# Patient Record
Sex: Male | Born: 1955 | Race: White | Hispanic: No | Smoking: Never smoker
Health system: Southern US, Community
[De-identification: ages and names within clinical notes are randomized; demographics above are authoritative.]

## PROBLEM LIST (undated history)

## (undated) DIAGNOSIS — I499 Cardiac arrhythmia, unspecified: Secondary | ICD-10-CM

## (undated) DIAGNOSIS — E785 Hyperlipidemia, unspecified: Secondary | ICD-10-CM

## (undated) DIAGNOSIS — R7303 Prediabetes: Secondary | ICD-10-CM

## (undated) DIAGNOSIS — E119 Type 2 diabetes mellitus without complications: Secondary | ICD-10-CM

## (undated) DIAGNOSIS — E079 Disorder of thyroid, unspecified: Secondary | ICD-10-CM

## (undated) DIAGNOSIS — I10 Essential (primary) hypertension: Secondary | ICD-10-CM

## (undated) DIAGNOSIS — E039 Hypothyroidism, unspecified: Secondary | ICD-10-CM

## (undated) DIAGNOSIS — Z87442 Personal history of urinary calculi: Secondary | ICD-10-CM

## (undated) DIAGNOSIS — G4730 Sleep apnea, unspecified: Secondary | ICD-10-CM

## (undated) DIAGNOSIS — E66811 Obesity, class 1: Secondary | ICD-10-CM

## (undated) DIAGNOSIS — C439 Malignant melanoma of skin, unspecified: Secondary | ICD-10-CM

## (undated) DIAGNOSIS — C801 Malignant (primary) neoplasm, unspecified: Secondary | ICD-10-CM

## (undated) HISTORY — DX: Sleep apnea, unspecified: G47.30

## (undated) HISTORY — PX: FOOT SURGERY: SHX648

---

## 1998-12-08 ENCOUNTER — Inpatient Hospital Stay (HOSPITAL_COMMUNITY): Admission: RE | Admit: 1998-12-08 | Discharge: 1998-12-09 | Payer: Self-pay | Admitting: Neurosurgery

## 1998-12-08 ENCOUNTER — Encounter: Payer: Self-pay | Admitting: Neurosurgery

## 2004-01-10 HISTORY — PX: BACK SURGERY: SHX140

## 2006-05-02 ENCOUNTER — Ambulatory Visit: Payer: Self-pay | Admitting: Podiatry

## 2007-09-12 ENCOUNTER — Emergency Department: Payer: Self-pay | Admitting: Emergency Medicine

## 2007-09-30 ENCOUNTER — Ambulatory Visit: Payer: Self-pay | Admitting: Specialist

## 2007-10-02 ENCOUNTER — Ambulatory Visit: Payer: Self-pay | Admitting: Specialist

## 2007-10-10 ENCOUNTER — Ambulatory Visit: Payer: Self-pay | Admitting: Specialist

## 2009-05-29 ENCOUNTER — Emergency Department: Payer: Self-pay | Admitting: Unknown Physician Specialty

## 2009-07-24 IMAGING — CT CT ABD-PELV W/O CM
1 of 2 series · 15 of 32 positions shown, 19 images · non-contrast
Comparison: none

REASON FOR EXAM: (1) left flank pain; (2) left flank pain
COMMENTS:

[Series 2: stone · axial · 0.77mm/px · z∈[-904,-493]mm · 15 of 154 slices shown, 19 images]
[im 11/154  soft-tissue]
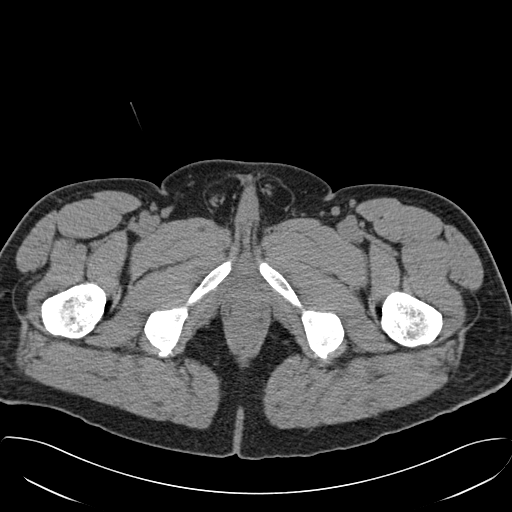
[im 11/154  bone]
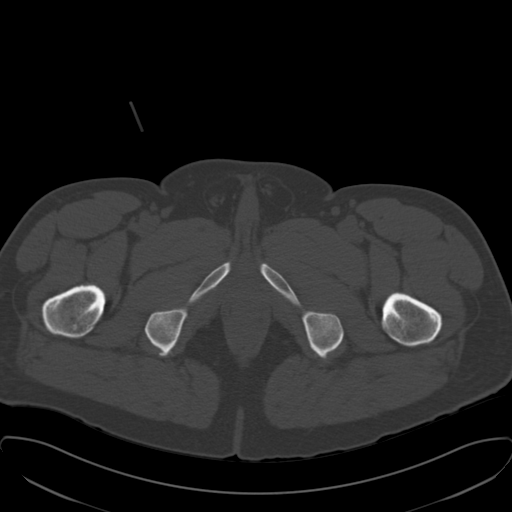
[im 22/154  soft-tissue]
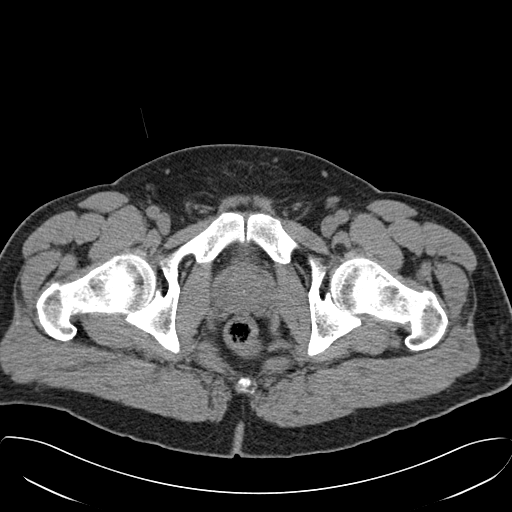
[im 33/154  soft-tissue]
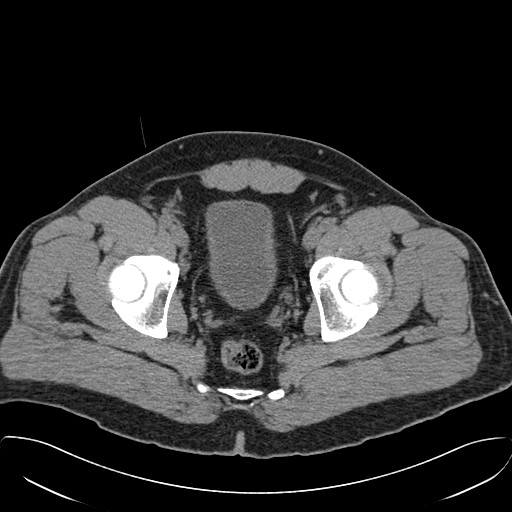
[im 44/154  soft-tissue]
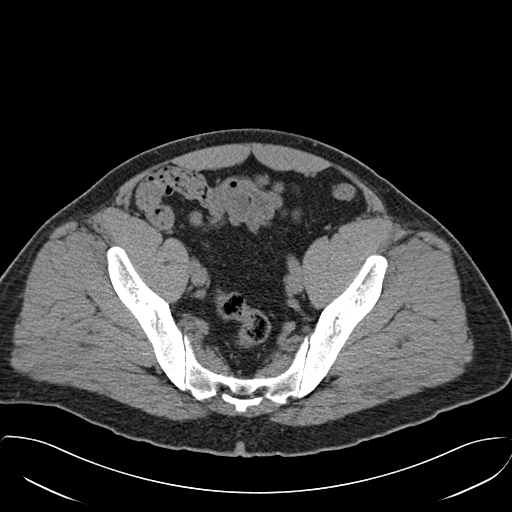
[im 55/154  soft-tissue]
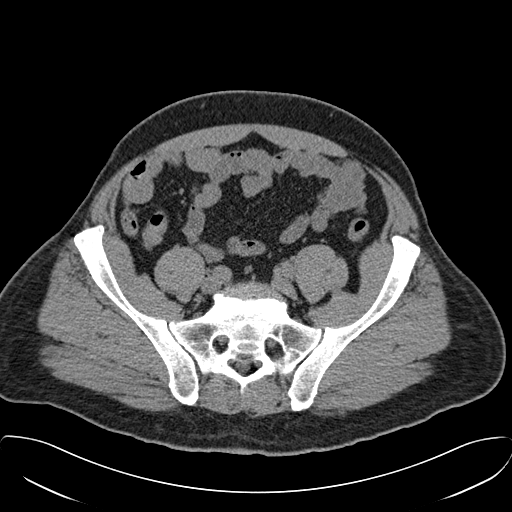
[im 66/154  soft-tissue]
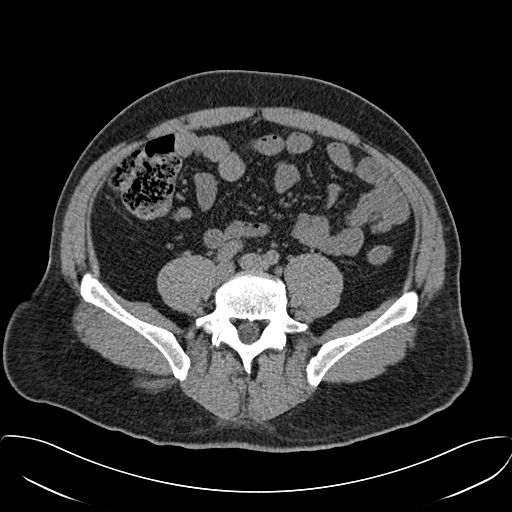
[im 77/154  soft-tissue]
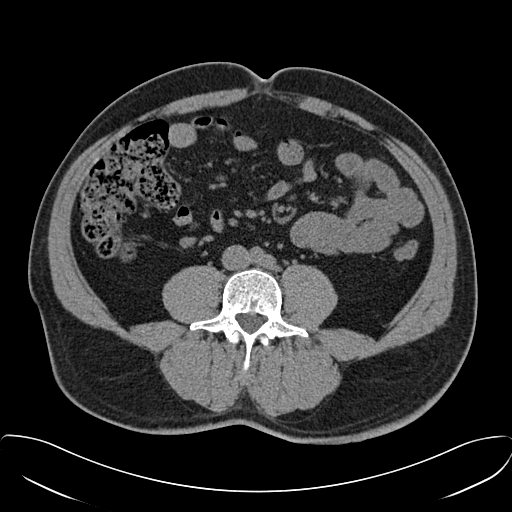
[im 88/154  soft-tissue]
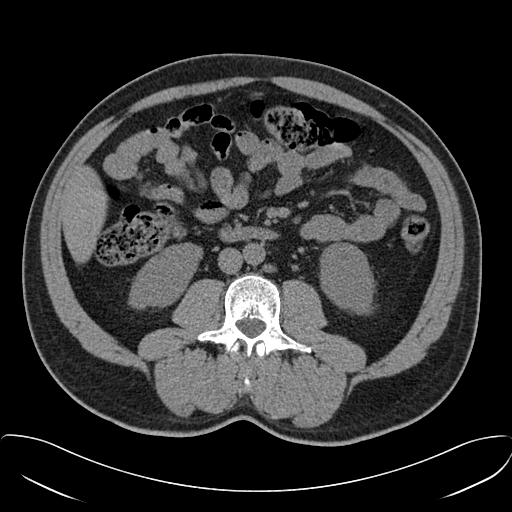
[im 99/154  soft-tissue]
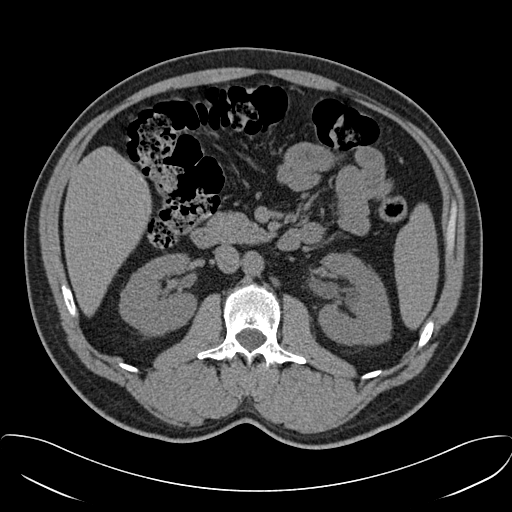
[im 99/154  bone]
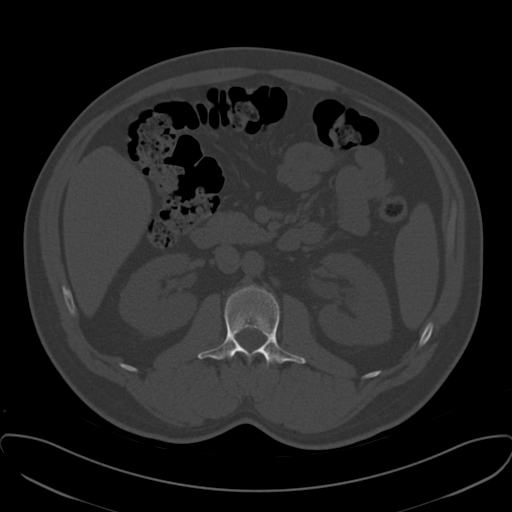
[im 110/154  soft-tissue]
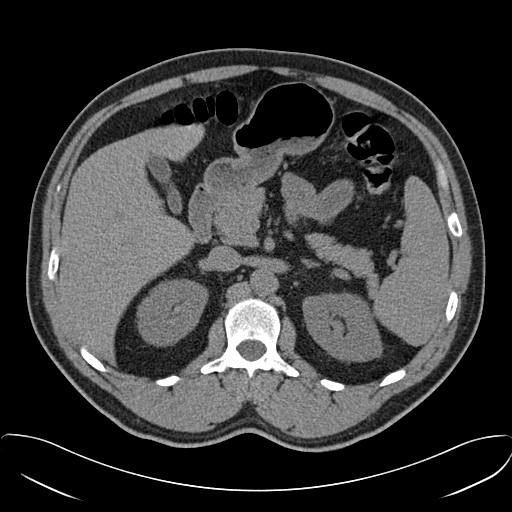
[im 121/154  soft-tissue]
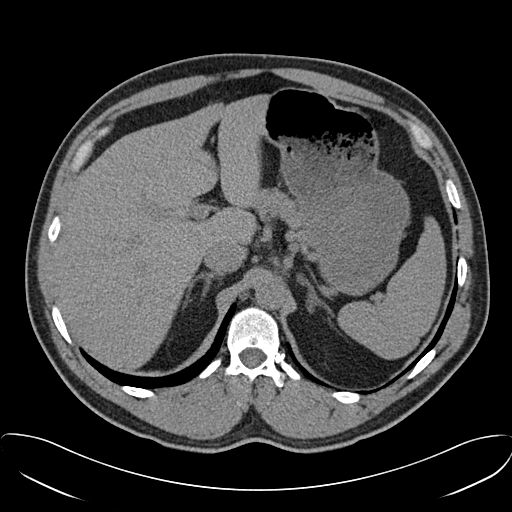
[im 132/154  soft-tissue]
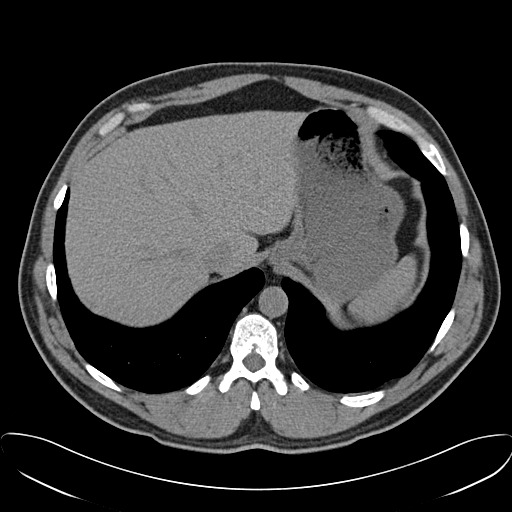
[im 132/154  lung]
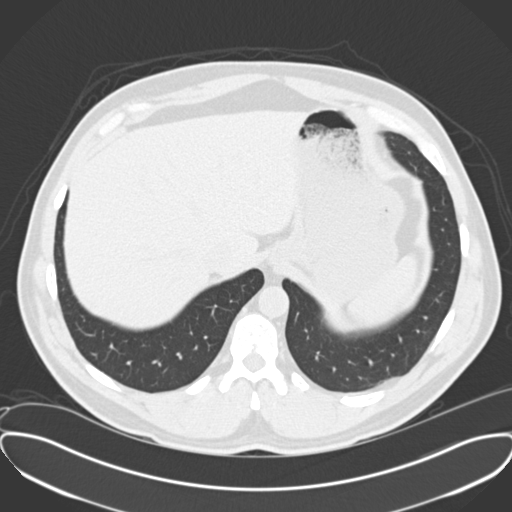
[im 137/154  lung]
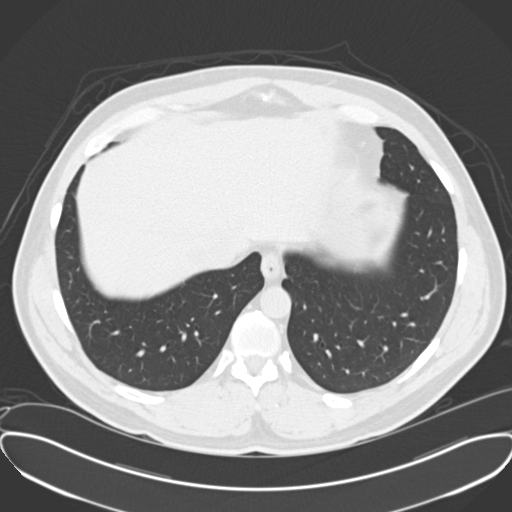
[im 143/154  soft-tissue]
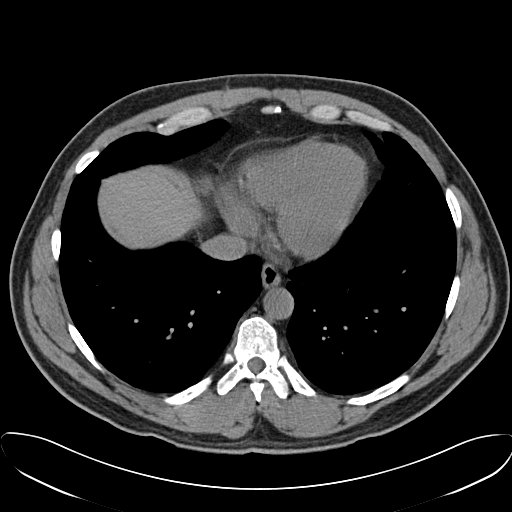
[im 143/154  lung]
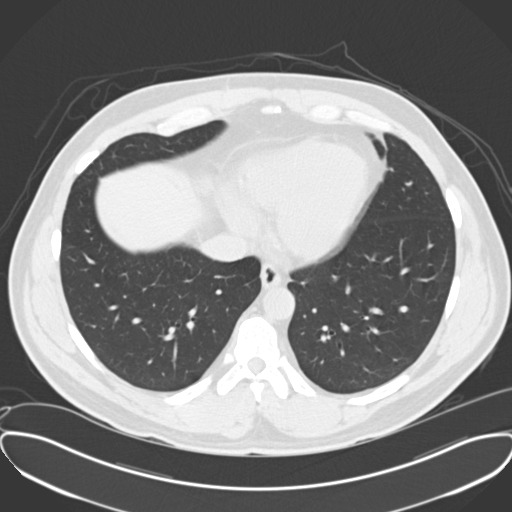
[im 148/154  lung]
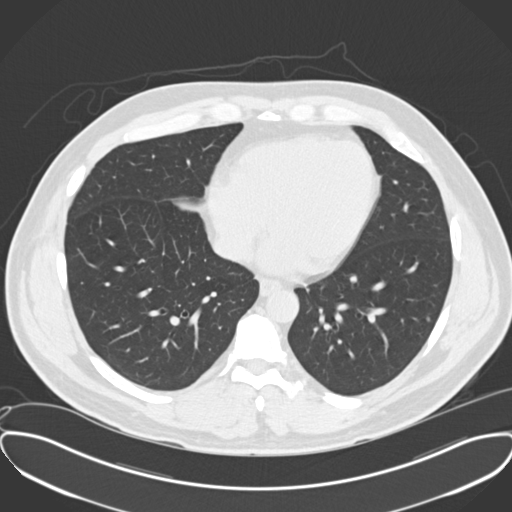

[15 of 32 positions shown; findings below may reference images not displayed]

PROCEDURE:     CT  - CT ABDOMEN AND PELVIS W[DATE] [DATE]

RESULT:     Helical non-contrast 3 mm sections were obtained from the lung
bases through the pubic symphysis.

Evaluation of the lung bases demonstrates no gross abnormalities.

Within the limitations of a noncontrast CT, the liver, spleen, adrenals and
pancreas are unremarkable. Evaluation of the RIGHT and LEFT kidneys
demonstrates small 1 to 2 mm size non-obstructing calculi within the
medullary portions of the kidney. Within the region of the ureteropelvic
junction on the LEFT a 5 x 7 mm calculus is identified. There is mild
associated obstructive uropathy. There is no evidence of hydroureter nor
ureterolithiasis. There is no evidence of abdominal or pelvic masses, free
fluid, drainable loculated fluid collections nor adenopathy within the
limitations of a noncontrast CT.  There is no evidence of abdominal aortic
aneurysm nor CT evidence of bowel obstruction, diverticulitis, colitis,
enteritis or appendicitis, if clinically appropriate.
IMPRESSION: 1.Non-obstructing bilateral nephrolithiasis.

2.5 x 7 mm calculus within the LEFT vesicoureteral junction. There is mild
associated obstructive uropathy.

Dr. Nala of the Emergency Room was informed of these findings via a
preliminary faxed report at [DATE] Central Time on 09-13-07.

## 2012-09-19 ENCOUNTER — Ambulatory Visit: Payer: Self-pay | Admitting: Otolaryngology

## 2016-03-09 ENCOUNTER — Other Ambulatory Visit: Payer: Self-pay | Admitting: Endocrinology

## 2016-03-09 DIAGNOSIS — I493 Ventricular premature depolarization: Secondary | ICD-10-CM

## 2016-03-10 ENCOUNTER — Emergency Department
Admission: EM | Admit: 2016-03-10 | Discharge: 2016-03-10 | Disposition: A | Discharge status: Home / Self Care | Payer: BLUE CROSS/BLUE SHIELD | Attending: Emergency Medicine | Admitting: Emergency Medicine

## 2016-03-10 ENCOUNTER — Emergency Department: Payer: BLUE CROSS/BLUE SHIELD

## 2016-03-10 DIAGNOSIS — R009 Unspecified abnormalities of heart beat: Secondary | ICD-10-CM | POA: Diagnosis present

## 2016-03-10 DIAGNOSIS — R51 Headache: Secondary | ICD-10-CM | POA: Diagnosis not present

## 2016-03-10 DIAGNOSIS — M436 Torticollis: Secondary | ICD-10-CM | POA: Diagnosis not present

## 2016-03-10 DIAGNOSIS — R519 Headache, unspecified: Secondary | ICD-10-CM

## 2016-03-10 DIAGNOSIS — I10 Essential (primary) hypertension: Secondary | ICD-10-CM | POA: Diagnosis not present

## 2016-03-10 HISTORY — DX: Essential (primary) hypertension: I10

## 2016-03-10 HISTORY — DX: Cardiac arrhythmia, unspecified: I49.9

## 2016-03-10 LAB — CBC
HCT: 42.7 % (ref 40.0–52.0)
Hemoglobin: 15.1 g/dL (ref 13.0–18.0)
MCH: 32.2 pg (ref 26.0–34.0)
MCHC: 35.3 g/dL (ref 32.0–36.0)
MCV: 91.2 fL (ref 80.0–100.0)
Platelets: 195 10*3/uL (ref 150–440)
RBC: 4.68 MIL/uL (ref 4.40–5.90)
RDW: 13 % (ref 11.5–14.5)
WBC: 8.3 10*3/uL (ref 3.8–10.6)

## 2016-03-10 LAB — BASIC METABOLIC PANEL
ANION GAP: 10 (ref 5–15)
BUN: 14 mg/dL (ref 6–20)
CALCIUM: 9.6 mg/dL (ref 8.9–10.3)
CO2: 26 mmol/L (ref 22–32)
CREATININE: 0.99 mg/dL (ref 0.61–1.24)
Chloride: 103 mmol/L (ref 101–111)
GFR calc non Af Amer: >60 mL/min (ref >60)
Glucose, Bld: 101 mg/dL — ABNORMAL HIGH (ref 65–99)
Potassium: 4.2 mmol/L (ref 3.5–5.1)
SODIUM: 139 mmol/L (ref 135–145)

## 2016-03-10 LAB — TROPONIN I: Troponin I: <0.03 ng/mL (ref <0.03)

## 2016-03-10 MED ORDER — DIAZEPAM 5 MG/ML IJ SOLN
5.0000 mg | Freq: Once | INTRAMUSCULAR | Status: AC
  Administered 2016-03-10: 5 mg via INTRAVENOUS
  Filled 2016-03-10: qty 2

## 2016-03-10 MED ORDER — KETOROLAC TROMETHAMINE 30 MG/ML IJ SOLN
30.0000 mg | Freq: Once | INTRAMUSCULAR | Status: AC
  Administered 2016-03-10: 30 mg via INTRAVENOUS

## 2016-03-10 MED ORDER — PROCHLORPERAZINE EDISYLATE 5 MG/ML IJ SOLN
10.0000 mg | Freq: Once | INTRAMUSCULAR | Status: AC
  Administered 2016-03-10: 10 mg via INTRAVENOUS
  Filled 2016-03-10: qty 2

## 2016-03-10 MED ORDER — SODIUM CHLORIDE 0.9 % IV BOLUS (SEPSIS)
1000.0000 mL | Freq: Once | INTRAVENOUS | Status: AC
  Administered 2016-03-10: 1000 mL via INTRAVENOUS

## 2016-03-10 MED ORDER — KETOROLAC TROMETHAMINE 30 MG/ML IJ SOLN
INTRAMUSCULAR | Status: AC
  Administered 2016-03-10: 30 mg via INTRAVENOUS
  Filled 2016-03-10: qty 1

## 2016-03-10 NOTE — ED Provider Notes (Addendum)
[facility] Emergency Department Provider Note   ____________________________________________  Time seen: Approximately 730 PM  I have reviewed the triage vital signs and the nursing notes.   HISTORY  Chief Complaint Torticollis and Irregular Heart Beat   HPI Sergio Kennedy is a 60 y.o. male tray of hypertension who is presenting to the emergency department today with an irregular heartbeat as well as posterior neck pain or neck stiffness. He says that the palpitations were discovered yesterday when he went for his yearly physical with his primary care doctor. However, he says that earlier today he started developing a headache which was mild throughout the day but then progressed to severe 10 out of 10 headache at about 6 PM. He says that it is now 9 and 10 and just annoying pain to the front of his head. He says that he has pain to his neck when he turns his head to the left. Says that this pain is in the right side of his neck. Denies any injury. Denies any heavy lifting. Denies any weakness, numbness. Denies any nausea or vomiting. His PCP sent him up for stress test because of the irregular heart rhythm.    Past Medical History  Diagnosis Date  . Hypertension   . Irregular heart rhythm     There are no active problems to display for this patient.   Past Surgical History  Procedure Laterality Date  . Back surgery    . Foot surgery      No current outpatient prescriptions on file.  Allergies Review of patient's allergies indicates no known allergies.  No family history on file.  Social History Social History  Substance Use Topics  . Smoking status: Never Smoker   . Smokeless tobacco: None  . Alcohol Use: No    Review of Systems Constitutional: No fever/chills Eyes: No visual changes. ENT: No sore throat. Cardiovascular: Denies chest pain. Respiratory: Denies shortness of breath. Gastrointestinal: No abdominal pain.  No nausea, no  vomiting.  No diarrhea.  No constipation. Genitourinary: Negative for dysuria. Musculoskeletal: Negative for back pain. Skin: Negative for rash. Neurological: Negative for focal weakness or numbness.  10-point ROS otherwise negative.  ____________________________________________   PHYSICAL EXAM:  VITAL SIGNS: ED Triage Vitals  Enc Vitals Group     BP 03/10/16 1856 164/99 mmHg     Pulse Rate 03/10/16 1856 93     Resp 03/10/16 1856 20     Temp 03/10/16 1856 98.2 F (36.8 C)     Temp Source 03/10/16 1856 Oral     SpO2 03/10/16 1856 95 %     Weight --      Height --      Head Cir --      Peak Flow --      Pain Score 03/10/16 1918 9     Pain Loc --      Pain Edu? --      Excl. in GC? --     Constitutional: Alert and oriented. Well appearing and in no acute distress. Eyes: Conjunctivae are normal. PERRL. EOMI. Head: Atraumatic. Nose: No congestion/rhinnorhea. Mouth/Throat: Mucous membranes are moist.  Neck: No stridor.  No tenderness to the midline or right side of the neck. Mild tenderness left-sided trapezius. Cardiovascular: Normal rate, regular rhythm. Grossly normal heart sounds.  Good peripheral circulation. Respiratory: Normal respiratory effort.  No retractions. Lungs CTAB. Gastrointestinal: Soft and nontender. No distention.  Musculoskeletal: No lower extremity tenderness nor edema.  No joint  effusions. Neurologic:  Normal speech and language. No gross focal neurologic deficits are appreciated.  Skin:  Skin is warm, dry and intact. No rash noted. Psychiatric: Mood and affect are normal. Speech and behavior are normal.  ____________________________________________   LABS (all labs ordered are listed, but only abnormal results are displayed)  Labs Reviewed  BASIC METABOLIC PANEL - Abnormal; Notable for the following:    Glucose, Bld 101 (*)    All other components within normal limits  CBC  TROPONIN I    ____________________________________________  EKG  ED ECG REPORT I, Arelia LongestSchaevitz,  Ernie Sagrero M, the attending physician, personally viewed and interpreted this ECG.   Date: 03/10/2016  EKG Time: 1840  Rate: 85  Rhythm: normal sinus rhythm with occasional PVCs.  Axis: Normal axis  Intervals:Incomplete right bundle-branch block.  ST&T Change: No ST segment elevation or depression. No abnormal T-wave inversion.  ____________________________________________  RADIOLOGY     CT Head Wo Contrast (Final result) Result time: 03/10/16 19:44:02   Final result by Rad Results In Interface (03/10/16 19:44:02)   Narrative:   CLINICAL DATA: Stiff neck and headache.  EXAM: CT HEAD WITHOUT CONTRAST  TECHNIQUE: Contiguous axial images were obtained from the base of the skull through the vertex without intravenous contrast.  COMPARISON: None.  FINDINGS: Brain: No evidence of acute infarction, hemorrhage, extra-axial collection, ventriculomegaly, or mass effect.  Vascular: No hyperdense vessel or unexpected calcification.  Skull: Negative for fracture or focal lesion.  Sinuses/Orbits: No acute findings.  Other: None.  IMPRESSION: No acute abnormalities.   Electronically Signed By: Gerome Samavid Williams III M.D On: 03/10/2016 19:44    ____________________________________________   PROCEDURES   ____________________________________________   INITIAL IMPRESSION / ASSESSMENT AND PLAN / ED COURSE  Pertinent labs & imaging results that were available during my care of the patient were reviewed by me and considered in my medical decision making (see chart for details).  ----------------------------------------- 9:56 PM on 03/10/2016 -----------------------------------------  Patient with pain completely relieved after Toradol. Unclear cause of the patient's symptoms. Possible torticollis and tension headache. Head CT done within 6 hours of the onset of the severe headache  and was negative. Neck stiffness is gone with pain meds. Feel that intracranial hemorrhage such as subarachnoid hemorrhage is less likely. The patient denies any history of internal hemorrhage and his family. Will continue with NSAIDs at home. Will follow-up with his primary care doctor. ____________________________________________   FINAL CLINICAL IMPRESSION(S) / ED DIAGNOSES  Torticollis. Headache.    NEW MEDICATIONS STARTED DURING THIS VISIT:  New Prescriptions   No medications on file     Note:  This document was prepared using Dragon voice recognition software and may include unintentional dictation errors.    Myrna Blazeravid Matthew Tawona Filsinger, MD 03/10/16 2158  Patient also afebrile and nontoxic. Unlikely to be meningitis.  Myrna Blazeravid Matthew Joud Ingwersen, MD 03/10/16 2159  Patient with intermittent PVCs without any runs of ventricular tachycardia. Does not feel the PVCs. Does not feel palpitations or chest pain.  Myrna Blazeravid Matthew Gerhard Rappaport, MD 03/10/16 209 [phone]

## 2016-03-10 NOTE — Discharge Instructions (Signed)
General Headache Without Cause A headache is pain or discomfort felt around the head or neck area. The specific cause of a headache may not be found. There are many causes and types of headaches. A few common ones are:  Tension headaches.  Migraine headaches.  Cluster headaches.  Chronic daily headaches. HOME CARE INSTRUCTIONS  Watch your condition for any changes. Take these steps to help with your condition: Managing Pain  Take over-the-counter and prescription medicines only as told by your health care provider.  Lie down in a dark, quiet room when you have a headache.  If directed, apply ice to the head and neck area:  Put ice in a plastic bag.  Place a towel between your skin and the bag.  Leave the ice on for 20 minutes, 2-3 times per day.  Use a heating pad or hot shower to apply heat to the head and neck area as told by your health care provider.  Keep lights dim if bright lights bother you or make your headaches worse. Eating and Drinking  Eat meals on a regular schedule.  Limit alcohol use.  Decrease the amount of caffeine you drink, or stop drinking caffeine. General Instructions  Keep all follow-up visits as told by your health care provider. This is important.  Keep a headache journal to help find out what may trigger your headaches. For example, write down:  What you eat and drink.  How much sleep you get.  Any change to your diet or medicines.  Try massage or other relaxation techniques.  Limit stress.  Sit up straight, and do not tense your muscles.  Do not use tobacco products, including cigarettes, chewing tobacco, or e-cigarettes. If you need help quitting, ask your health care provider.  Exercise regularly as told by your health care provider.  Sleep on a regular schedule. Get 7-9 hours of sleep, or the amount recommended by your health care provider. SEEK MEDICAL CARE IF:   Your symptoms are not helped by medicine.  You have a  headache that is different from the usual headache.  You have nausea or you vomit.  You have a fever. SEEK IMMEDIATE MEDICAL CARE IF:   Your headache becomes severe.  You have repeated vomiting.  You have a stiff neck.  You have a loss of vision.  You have problems with speech.  You have pain in the eye or ear.  You have muscular weakness or loss of muscle control.  You lose your balance or have trouble walking.  You feel faint or pass out.  You have confusion.   This information is not intended to replace advice given to you by your health care provider. Make sure you discuss any questions you have with your health care provider.   Document Released: 08/28/2005 Document Revised: 05/19/2015 Document Reviewed: 12/21/2014 Elsevier Interactive Patient Education 2016 [employer]  Acute Torticollis Torticollis is a condition in which the muscles of the neck tighten (contract) abnormally, causing the neck to twist and the head to move into an unnatural position. Torticollis that develops suddenly is called acute torticollis. If torticollis becomes chronic and is left untreated, the face and neck can become deformed. CAUSES This condition may be caused by:  Sleeping in an awkward position (common).  Extending or twisting the neck muscles beyond their normal position.  Infection. In some cases, the cause may not be known. SYMPTOMS Symptoms of this condition include:  An unnatural position of the head.  Neck pain.  A  limited ability to move the neck.  Twisting of the neck to one side. DIAGNOSIS This condition is diagnosed with a physical exam. You may also have imaging tests, such as an X-ray, CT scan, or MRI. TREATMENT Treatment for this condition involves trying to relax the neck muscles. It may include:  Medicines or shots.  Physical therapy.  Surgery. This may be done in severe cases. HOME CARE INSTRUCTIONS  Take medicines only as directed by your health  care provider.  Do stretching exercises and massage your neck as directed by your health care provider.  Keep all follow-up visits as directed by your health care provider. This is important. SEEK MEDICAL CARE IF:  You develop a fever. SEEK IMMEDIATE MEDICAL CARE IF:  You develop difficulty breathing.  You develop noisy breathing (stridor).  You start drooling.  You have trouble swallowing or have pain with swallowing.  You develop numbness or weakness in your hands or feet.  You have changes in your speech, understanding, or vision.  Your pain gets worse.   This information is not intended to replace advice given to you by your health care provider. Make sure you discuss any questions you have with your health care provider.   Document Released: 08/25/2000 Document Revised: 01/12/2015 Document Reviewed: 08/24/2014 Elsevier Interactive Patient Education [employer] Elsevier Inc.

## 2016-03-10 NOTE — ED Notes (Signed)
Pt came to ED via pov c/o stiff neck and headache. Pt reports seen at pcp and was told that he had an irregular heart beat. Pt supposed to get stress test next week. Pt is clammy.

## 2016-03-22 ENCOUNTER — Ambulatory Visit
Admission: RE | Admit: 2016-03-22 | Discharge: 2016-03-22 | Disposition: A | Discharge status: Home / Self Care | Payer: BLUE CROSS/BLUE SHIELD | Source: Ambulatory Visit | Attending: Endocrinology | Admitting: Endocrinology

## 2016-03-22 DIAGNOSIS — I340 Nonrheumatic mitral (valve) insufficiency: Secondary | ICD-10-CM | POA: Diagnosis not present

## 2016-03-22 DIAGNOSIS — I351 Nonrheumatic aortic (valve) insufficiency: Secondary | ICD-10-CM | POA: Diagnosis not present

## 2016-03-22 DIAGNOSIS — I493 Ventricular premature depolarization: Secondary | ICD-10-CM | POA: Diagnosis present

## 2016-03-22 DIAGNOSIS — I119 Hypertensive heart disease without heart failure: Secondary | ICD-10-CM | POA: Diagnosis not present

## 2016-03-22 NOTE — Progress Notes (Signed)
*  PRELIMINARY RESULTS* Echocardiogram 2D Echocardiogram has been performed.  Cristela BlueHege, Zoran Yankee 03/22/2016, 10:46 AM

## 2017-11-21 ENCOUNTER — Ambulatory Visit: Payer: BLUE CROSS/BLUE SHIELD

## 2017-11-21 DIAGNOSIS — G4733 Obstructive sleep apnea (adult) (pediatric): Secondary | ICD-10-CM

## 2017-11-21 NOTE — Progress Notes (Signed)
95 percentile pressure 15   95th percentile leak 2.4   apnea index 0.0 /hr  apnea-hypopnea index  0.4 /hr   total days used  >4 hr 132 days  total days used <4 hr 0 days  Total compliance 100 percent  Sergio Kennedy doing great, benefits from cpap.

## 2018-05-23 ENCOUNTER — Encounter: Payer: Self-pay | Admitting: Internal Medicine

## 2018-05-23 ENCOUNTER — Ambulatory Visit: Payer: BLUE CROSS/BLUE SHIELD | Admitting: Internal Medicine

## 2018-05-23 VITALS — BP 120/80 | HR 78 | Resp 16 | Ht 68.0 in | Wt 205.0 lb

## 2018-05-23 DIAGNOSIS — I10 Essential (primary) hypertension: Secondary | ICD-10-CM | POA: Diagnosis not present

## 2018-05-23 DIAGNOSIS — I482 Chronic atrial fibrillation, unspecified: Secondary | ICD-10-CM

## 2018-05-23 DIAGNOSIS — G4733 Obstructive sleep apnea (adult) (pediatric): Secondary | ICD-10-CM | POA: Diagnosis not present

## 2018-05-23 NOTE — Progress Notes (Signed)
[facility] [address] [place], [place] [address]  Pulmonary Sleep Medicine   Office Visit Note  Patient Name: Sergio Kennedy DOB: 05/11/1956 MRN [id number]  Date of Service: 05/23/2018  Complaints/HPI: Pt here for follow up on OSA using CPAP.  He reports using his CPAP every night, and even when he naps during the day. He is very pleased with his results.  He denies daytime sleepiness, fatigue, or snoring. He is cleaning is CPAP with a soclean machine. He denies mask leak and changes is tubing and mask periodically. Denies chest pain, sob or new sinus issues.    ROS  General: (-) fever, (-) chills, (-) night sweats, (-) weakness Skin: (-) rashes, (-) itching,. Eyes: (-) visual changes, (-) redness, (-) itching. Nose and Sinuses: (-) nasal stuffiness or itchiness, (-) postnasal drip, (-) nosebleeds, (-) sinus trouble. Mouth and Throat: (-) sore throat, (-) hoarseness. Neck: (-) swollen glands, (-) enlarged thyroid, (-) neck pain. Respiratory: - cough, (-) bloody sputum, - shortness of breath, - wheezing. Cardiovascular: - ankle swelling, (-) chest pain. Lymphatic: (-) lymph node enlargement. Neurologic: (-) numbness, (-) tingling. Psychiatric: (-) anxiety, (-) depression   Current Medication: Outpatient Encounter Medications as of 05/23/2018  Medication Sig  . aspirin EC 81 MG tablet Take 81 mg by mouth daily.  Marland Kitchen amLODipine (NORVASC) 2.5 MG tablet TAKE 1 TABLET BY MOUTH EVERY DAY IN THE MORNING  . losartan-hydrochlorothiazide (HYZAAR) 50-12.5 MG tablet Take 1 tablet by mouth 2 (two) times daily.   No facility-administered encounter medications on file as of 05/23/2018.     Surgical History: Past Surgical History:  Procedure Laterality Date  . BACK SURGERY    . FOOT SURGERY      Medical History: Past Medical History:  Diagnosis Date  . Hypertension   . Irregular heart rhythm   . Sleep apnea     Family History: Family History  Problem Relation Age  of Onset  . Diabetes Mother   . Colon cancer Father     Social History: Social History   Socioeconomic History  . Marital status: Married    Spouse name: Not on file  . Number of children: Not on file  . Years of education: Not on file  . Highest education level: Not on file  Occupational History  . Not on file  Social Needs  . Financial resource strain: Not on file  . Food insecurity:    Worry: Not on file    Inability: Not on file  . Transportation needs:    Medical: Not on file    Non-medical: Not on file  Tobacco Use  . Smoking status: Never Smoker  . Smokeless tobacco: Never Used  Substance and Sexual Activity  . Alcohol use: No  . Drug use: Not on file  . Sexual activity: Not on file  Lifestyle  . Physical activity:    Days per week: Not on file    Minutes per session: Not on file  . Stress: Not on file  Relationships  . Social connections:    Talks on phone: Not on file    Gets together: Not on file    Attends religious service: Not on file    Active member of club or organization: Not on file    Attends meetings of clubs or organizations: Not on file    Relationship status: Not on file  . Intimate partner violence:    Fear of current or ex partner: Not on file  Emotionally abused: Not on file    Physically abused: Not on file    Forced sexual activity: Not on file  Other Topics Concern  . Not on file  Social History Narrative  . Not on file    Vital Signs: Blood pressure 120/80, pulse 78, resp. rate 16, height 5\' 8"  (1.727 m), weight 205 lb (93 kg), SpO2 98 %.  Examination: General Appearance: The patient is well-developed, well-nourished, and in no distress. Skin: Gross inspection of skin unremarkable. Head: normocephalic, no gross deformities. Eyes: no gross deformities noted. ENT: ears appear grossly normal no exudates. Neck: Supple. No thyromegaly. No LAD. Respiratory: Clear to auscultation bilateraly. Cardiovascular: Normal S1 and S2  without murmur or rub. Extremities: No cyanosis. pulses are equal. Neurologic: Alert and oriented. No involuntary movements.  LABS: No results found for this or any previous visit (from the past 2160 hour(s)).  Radiology: No results found.  No results found.  No results found.    Assessment and Plan: Patient Active Problem List   Diagnosis Date Noted  . Obstructive sleep apnea 05/23/2018  . Chronic atrial fibrillation (HCC) 05/23/2018   1. Obstructive sleep apnea Continues using CPAP as prescribed. Follow up in 1 year.   2. Hypertension, unspecified type Stable, controlled.  Continues taking medication as ordered.   3. Chronic atrial fibrillation (HCC) Followed by Dr. Dr. Elwin MochaMorearti.    General Counseling: I have discussed the findings of the evaluation and examination with Shaman.  I have also discussed any further diagnostic evaluation thatmay be needed or ordered today. Riely verbalizes understanding of the findings of todays visit. We also reviewed his medications today and discussed drug interactions and side effects including but not limited excessive drowsiness and altered mental states. We also discussed that there is always a risk not just to him but also people around him. he has been encouraged to call the office with any questions or concerns that should arise related to todays visit.    Time spent: 25 This patient was seen by Blima LedgerAdam Thermon Zulauf AGNP-C in Collaboration with Dr. Freda MunroSaadat Khan as a part of collaborative care agreement.   I have personally obtained a history, examined the patient, evaluated laboratory and imaging results, formulated the assessment and plan and placed orders.    Yevonne PaxSaadat A Khan, MD [facility] Pulmonary and Critical Care Sleep medicine

## 2018-05-23 NOTE — Patient Instructions (Signed)

## 2018-11-20 ENCOUNTER — Ambulatory Visit: Payer: BC Managed Care – PPO

## 2018-11-20 DIAGNOSIS — G4733 Obstructive sleep apnea (adult) (pediatric): Secondary | ICD-10-CM

## 2018-11-20 NOTE — Progress Notes (Signed)
95 percentile pressure 15   95th percentile leak 6.4   apnea index 0.0 /hr  apnea-hypopnea index  0.3 /hr   total days used  >4 hr 90 days  total days used <4 hr 0 days  Total compliance 100 percent  No problems or questions at this time

## 2019-05-26 ENCOUNTER — Ambulatory Visit: Payer: BC Managed Care – PPO | Admitting: Internal Medicine

## 2019-05-26 ENCOUNTER — Other Ambulatory Visit: Payer: Self-pay

## 2019-05-26 ENCOUNTER — Encounter: Payer: Self-pay | Admitting: Internal Medicine

## 2019-05-26 VITALS — BP 160/90 | HR 75 | Resp 16 | Ht 68.0 in | Wt 209.0 lb

## 2019-05-26 DIAGNOSIS — I482 Chronic atrial fibrillation, unspecified: Secondary | ICD-10-CM

## 2019-05-26 DIAGNOSIS — G4733 Obstructive sleep apnea (adult) (pediatric): Secondary | ICD-10-CM | POA: Diagnosis not present

## 2019-05-26 NOTE — Patient Instructions (Signed)

## 2019-05-26 NOTE — Progress Notes (Signed)
[facility] [address] [place], [place] [id number]  Pulmonary Sleep Medicine   Office Visit Note  Patient Name: Sergio Kennedy DOB: 06/10/1956 MRN [id number]  Date of Service: 05/26/2019  Complaints/HPI: Patient is here for follow-up of her sleep apnea.  He states he is doing very well has been using the CPAP device religiously.  His last download shows 100% compliance.  States he is not tired is not sleepy.  He is very refreshed even in the afternoons.  Patient states that he has had no issues with supplies.  States he continues to work and is doing fine at work also.  ROS  General: (-) fever, (-) chills, (-) night sweats, (-) weakness Skin: (-) rashes, (-) itching,. Eyes: (-) visual changes, (-) redness, (-) itching. Nose and Sinuses: (-) nasal stuffiness or itchiness, (-) postnasal drip, (-) nosebleeds, (-) sinus trouble. Mouth and Throat: (-) sore throat, (-) hoarseness. Neck: (-) swollen glands, (-) enlarged thyroid, (-) neck pain. Respiratory: - cough, (-) bloody sputum, - shortness of breath, - wheezing. Cardiovascular: - ankle swelling, (-) chest pain. Lymphatic: (-) lymph node enlargement. Neurologic: (-) numbness, (-) tingling. Psychiatric: (-) anxiety, (-) depression   Current Medication: Outpatient Encounter Medications as of 05/26/2019  Medication Sig  . aspirin EC 81 MG tablet Take 81 mg by mouth daily.  Marland Kitchen. losartan-hydrochlorothiazide (HYZAAR) 50-12.5 MG tablet Take 1 tablet by mouth 2 (two) times daily.  Marland Kitchen. amLODipine (NORVASC) 2.5 MG tablet TAKE 1 TABLET BY MOUTH EVERY DAY IN THE MORNING   No facility-administered encounter medications on file as of 05/26/2019.     Surgical History: Past Surgical History:  Procedure Laterality Date  . BACK SURGERY    . FOOT SURGERY      Medical History: Past Medical History:  Diagnosis Date  . Hypertension   . Irregular heart rhythm   . Sleep apnea     Family History: Family History  Problem  Relation Age of Onset  . Diabetes Mother   . Colon cancer Father     Social History: Social History   Socioeconomic History  . Marital status: Married    Spouse name: Not on file  . Number of children: Not on file  . Years of education: Not on file  . Highest education level: Not on file  Occupational History  . Not on file  Social Needs  . Financial resource strain: Not on file  . Food insecurity    Worry: Not on file    Inability: Not on file  . Transportation needs    Medical: Not on file    Non-medical: Not on file  Tobacco Use  . Smoking status: Never Smoker  . Smokeless tobacco: Never Used  Substance and Sexual Activity  . Alcohol use: No  . Drug use: Not on file  . Sexual activity: Not on file  Lifestyle  . Physical activity    Days per week: Not on file    Minutes per session: Not on file  . Stress: Not on file  Relationships  . Social Musicianconnections    Talks on phone: Not on file    Gets together: Not on file    Attends religious service: Not on file    Active member of club or organization: Not on file    Attends meetings of clubs or organizations: Not on file    Relationship status: Not on file  . Intimate partner violence    Fear of current or ex partner: Not  on file    Emotionally abused: Not on file    Physically abused: Not on file    Forced sexual activity: Not on file  Other Topics Concern  . Not on file  Social History Narrative  . Not on file    Vital Signs: Blood pressure (!) 160/90, pulse 75, resp. rate 16, height 5\' 8"  (1.727 m), weight 209 lb (94.8 kg), SpO2 96 %.  Examination: General Appearance: The patient is well-developed, well-nourished, and in no distress. Skin: Gross inspection of skin unremarkable. Head: normocephalic, no gross deformities. Eyes: no gross deformities noted. ENT: ears appear grossly normal no exudates. Neck: Supple. No thyromegaly. No LAD. Respiratory: no rhonchi noted at this time. Cardiovascular: Normal  S1 and S2 without murmur or rub. Extremities: No cyanosis. pulses are equal. Neurologic: Alert and oriented. No involuntary movements.  LABS: No results found for this or any previous visit (from the past 2160 hour(s)).  Radiology: No results found.  No results found.  No results found.    Assessment and Plan: Patient Active Problem List   Diagnosis Date Noted  . Obstructive sleep apnea 05/23/2018  . Chronic atrial fibrillation 05/23/2018    1. OSA continue with the CPAP at the recommended pressures.  The patient has been tolerating his pressures fairly well. 2. Morbid obesity needs to work on diet and exercise to work on weight loss this was discussed with the patient again at length 3. Chronic Atrial Fibrillation currently the rate is controlled we will continue with supportive care  General Counseling: I have discussed the findings of the evaluation and examination with Sergio Kennedy.  I have also discussed any further diagnostic evaluation thatmay be needed or ordered today. Sergio Kennedy verbalizes understanding of the findings of todays visit. We also reviewed his medications today and discussed drug interactions and side effects including but not limited excessive drowsiness and altered mental states. We also discussed that there is always a risk not just to him but also people around him. he has been encouraged to call the office with any questions or concerns that should arise related to todays visit.    Time spent: 15 minutes  I have personally obtained a history, examined the patient, evaluated laboratory and imaging results, formulated the assessment and plan and placed orders.    Allyne Gee, MD [facility] Pulmonary and Critical Care Sleep medicine

## 2019-12-01 ENCOUNTER — Telehealth: Payer: Self-pay

## 2019-12-01 NOTE — Telephone Encounter (Signed)
Confirmed and screened pt appt for 12/03/19 

## 2019-12-03 ENCOUNTER — Other Ambulatory Visit: Payer: Self-pay

## 2019-12-03 ENCOUNTER — Ambulatory Visit: Payer: BC Managed Care – PPO

## 2019-12-03 DIAGNOSIS — G4733 Obstructive sleep apnea (adult) (pediatric): Secondary | ICD-10-CM

## 2019-12-03 NOTE — Progress Notes (Signed)
95 percentile pressure 15   95th percentile leak 56.8   apnea index 0.0 /hr  apnea-hypopnea index  0.1 /hr   total days used  >4 hr 90 days  total days used <4 hr 0 days  Total compliance 100 percent  He is doing great, no problems or questions at this time. The leak ccame from accidentally leaving machine on for a few minuets

## 2020-05-25 ENCOUNTER — Telehealth: Payer: Self-pay

## 2020-05-25 NOTE — Telephone Encounter (Signed)
CONFIRMED PT APPT 05/27/20 

## 2020-05-27 ENCOUNTER — Ambulatory Visit: Payer: BC Managed Care – PPO | Admitting: Internal Medicine

## 2020-06-08 ENCOUNTER — Ambulatory Visit: Payer: BC Managed Care – PPO | Admitting: Internal Medicine

## 2020-06-08 ENCOUNTER — Other Ambulatory Visit: Payer: Self-pay

## 2020-06-08 ENCOUNTER — Encounter: Payer: Self-pay | Admitting: Internal Medicine

## 2020-06-08 DIAGNOSIS — G4733 Obstructive sleep apnea (adult) (pediatric): Secondary | ICD-10-CM

## 2020-06-08 DIAGNOSIS — I482 Chronic atrial fibrillation, unspecified: Secondary | ICD-10-CM

## 2020-06-08 DIAGNOSIS — Z7189 Other specified counseling: Secondary | ICD-10-CM

## 2020-06-08 NOTE — Progress Notes (Signed)
[facility] [address] [place], [place] [address]  Pulmonary Sleep Medicine   Office Visit Note  Patient Name: Sergio Kennedy DOB: 16-Nov-1955 MRN [id number]  Date of Service: 06/10/2020  Complaints/HPI: Patient is here for routine pulmonary follow-up OSA-he reports nightly compliance with his CPAP, no issues, denies dryness in his mouth or eyes, denies headaches or bloating upon awakening, feels rested throughout the day Download in March shows 100% compliance, AHI 0.1 He has been using SoClean machine, advised per FDA to stop using cleaning machine  ROS  General: (-) fever, (-) chills, (-) night sweats, (-) weakness Skin: (-) rashes, (-) itching,. Eyes: (-) visual changes, (-) redness, (-) itching. Nose and Sinuses: (-) nasal stuffiness or itchiness, (-) postnasal drip, (-) nosebleeds, (-) sinus trouble. Mouth and Throat: (-) sore throat, (-) hoarseness. Neck: (-) swollen glands, (-) enlarged thyroid, (-) neck pain. Respiratory: - cough, (-) bloody sputum, - shortness of breath, - wheezing. Cardiovascular: - ankle swelling, (-) chest pain. Lymphatic: (-) lymph node enlargement. Neurologic: (-) numbness, (-) tingling. Psychiatric: (-) anxiety, (-) depression   Current Medication: Outpatient Encounter Medications as of 06/08/2020  Medication Sig  . aspirin EC 81 MG tablet Take 81 mg by mouth daily.  Marland Kitchen levothyroxine (SYNTHROID) 75 MCG tablet Take 75 mcg by mouth daily before breakfast.  . losartan-hydrochlorothiazide (HYZAAR) 50-12.5 MG tablet Take 1 tablet by mouth 2 (two) times daily.  . [DISCONTINUED] amLODipine (NORVASC) 2.5 MG tablet TAKE 1 TABLET BY MOUTH EVERY DAY IN THE MORNING (Patient not taking: Reported on 06/08/2020)   No facility-administered encounter medications on file as of 06/08/2020.    Surgical History: Past Surgical History:  Procedure Laterality Date  . BACK SURGERY    . FOOT SURGERY      Medical History: Past Medical History:   Diagnosis Date  . Hypertension   . Irregular heart rhythm   . Sleep apnea     Family History: Family History  Problem Relation Age of Onset  . Diabetes Mother   . Colon cancer Father     Social History: Social History   Socioeconomic History  . Marital status: Married    Spouse name: Not on file  . Number of children: Not on file  . Years of education: Not on file  . Highest education level: Not on file  Occupational History  . Not on file  Tobacco Use  . Smoking status: Never Smoker  . Smokeless tobacco: Never Used  Substance and Sexual Activity  . Alcohol use: No  . Drug use: Not on file  . Sexual activity: Not on file  Other Topics Concern  . Not on file  Social History Narrative  . Not on file   Social Determinants of Health   Financial Resource Strain:   . Difficulty of Paying Living Expenses: Not on file  Food Insecurity:   . Worried About Programme researcher, broadcasting/film/video in the Last Year: Not on file  . Ran Out of Food in the Last Year: Not on file  Transportation Needs:   . Lack of Transportation (Medical): Not on file  . Lack of Transportation (Non-Medical): Not on file  Physical Activity:   . Days of Exercise per Week: Not on file  . Minutes of Exercise per Session: Not on file  Stress:   . Feeling of Stress : Not on file  Social Connections:   . Frequency of Communication with Friends and Family: Not on file  . Frequency of Social Gatherings  with Friends and Family: Not on file  . Attends Religious Services: Not on file  . Active Member of Clubs or Organizations: Not on file  . Attends Banker Meetings: Not on file  . Marital Status: Not on file  Intimate Partner Violence:   . Fear of Current or Ex-Partner: Not on file  . Emotionally Abused: Not on file  . Physically Abused: Not on file  . Sexually Abused: Not on file    Vital Signs: Blood pressure (!) 140/92, pulse 83, temperature 97.7 F (36.5 C), resp. rate 16, height 5\' 8"  (1.727 m),  weight 214 lb (97.1 kg), SpO2 97 %.  Examination: General Appearance: The patient is well-developed, well-nourished, and in no distress. Skin: Gross inspection of skin unremarkable. Head: normocephalic, no gross deformities. Eyes: no gross deformities noted. ENT: ears appear grossly normal no exudates. Neck: Supple. No thyromegaly. No LAD. Respiratory: Clear throughout, no wheezing or rhonchi. Cardiovascular: Normal S1 and S2 without murmur or rub. Extremities: No cyanosis. pulses are equal. Neurologic: Alert and oriented. No involuntary movements.  LABS: No results found for this or any previous visit (from the past 2160 hour(s)).  Radiology: ECHO COMPLETE  Result Date: 03/22/2016                 [facility]*                       [address]                        [place], [place]                            [phone] ------------------------------------------------------------------- Transthoracic Echocardiography Patient:    Sergio Kennedy, Sergio Kennedy MR #:       Sergio Kennedy Comment Study Date: 03/22/2016 Gender:     M Age:        61 Height: Weight: BSA: Pt. Status: Room:  ATTENDING    60, MD  ORDERING     Horton Chin, MD  PERFORMING   Horton Chin, MD  REFERRING    Horton Chin, MD  SONOGRAPHER  Horton Chin RDCS cc: ------------------------------------------------------------------- LV EF: 70% ------------------------------------------------------------------- Indications:      Ventricular premature beats. ------------------------------------------------------------------- History:   PMH:  Irregular heart rhythm.  Risk factors: Hypertension. ------------------------------------------------------------------- Study Conclusions - Left ventricle: The cavity size was normal. Systolic function was   vigorous. The estimated ejection fraction was 70%. Wall motion   was normal; there were no regional wall motion abnormalities. - Aortic valve: There was  trivial regurgitation. Valve area (Vmax):   2.86 cm^2. - Mitral valve: There was mild regurgitation. - Left atrium: The atrium was mildly dilated. - Atrial septum: No defect or patent foramen ovale was identified. Impressions: - Normal LVEF and wallmotion and mild MR. ------------------------------------------------------------------- Study data:   Study status:  Routine.  Procedure:  Transthoracic echocardiography. Image quality was adequate. Transthoracic echocardiography.  M-mode, complete 2D, spectral Doppler, and color Doppler.  Birthdate:  Patient birthdate: May 25, 1956.  Age:  Patient is 64 yr old.  Sex:  Gender: male. Blood pressure:     134/88  Patient status:  Outpatient.  Study date:  Study date: 03/22/2016. Study time: 10:12 AM. ------------------------------------------------------------------- ------------------------------------------------------------------- Left ventricle:  The cavity size was normal. Systolic function was vigorous. The estimated ejection fraction was 70%. Wall motion was normal; there were no  regional wall motion abnormalities. ------------------------------------------------------------------- Aortic valve:   Trileaflet; normal thickness leaflets. Mobility was not restricted.  Doppler:  Transvalvular velocity was within the normal range. There was no stenosis. There was trivial regurgitation.    Peak velocity ratio of LVOT to aortic valve: 0.91. Valve area (Vmax): 2.86 cm^2.    Peak gradient (S): 6 mm Hg.  ------------------------------------------------------------------- Aorta:  The aorta was normal, not dilated, and non-diseased. Aortic root: The aortic root was normal in size. ------------------------------------------------------------------- Mitral valve:   Structurally normal valve.   Mobility was not restricted.  Doppler:  Transvalvular velocity was within the normal range. There was no evidence for stenosis. There was mild regurgitation.    Peak gradient (D): 3 mm Hg.  ------------------------------------------------------------------- Left atrium:  The atrium was mildly dilated. ------------------------------------------------------------------- Atrial septum:  Poorly visualized. No defect or patent foramen ovale was identified. ------------------------------------------------------------------- Pulmonary veins: Common pulmonary vein:  The Doppler velocity and flow profile were normal. ------------------------------------------------------------------- Right ventricle:  The cavity size was normal. Wall thickness was normal. Systolic function was normal. ------------------------------------------------------------------- Pulmonic valve:    Doppler:  Transvalvular velocity was within the normal range. There was no evidence for stenosis. There was trivial regurgitation. ------------------------------------------------------------------- Tricuspid valve:   Structurally normal valve.    Doppler: Transvalvular velocity was within the normal range. There was mild regurgitation. ------------------------------------------------------------------- Pulmonary artery:   Poorly visualized. The main pulmonary artery was normal-sized. Systolic pressure was within the normal range.  ------------------------------------------------------------------- Right atrium:  Poorly visualized. The atrium was normal in size.  ------------------------------------------------------------------- Pericardium:  The pericardium was normal in appearance. There was no pericardial effusion. ------------------------------------------------------------------- Systemic veins: Inferior vena cava: The vessel was normal in size. ------------------------------------------------------------------- Post procedure conclusions Ascending Aorta: - The aorta was normal, not dilated, and non-diseased. ------------------------------------------------------------------- Measurements  Left ventricle                           Value        Reference  LV ID, ED, PLAX chordal                  48.7  mm    43 - 52  LV ID, ES, PLAX chordal                  27    mm    23 - 38  LV fx shortening, PLAX chordal           45    %     >=29  LV PW thickness, ED                      11    mm    ---------  IVS/LV PW ratio, ED                      0.75        <=1.3  LV e&', lateral                           12.8  cm/s  ---------  LV E/e&', lateral                         6.63        ---------  LV e&', medial  9.36  cm/s  ---------  LV E/e&', medial                          9.07        ---------  LV e&', average                           11.08 cm/s  ---------  LV E/e&', average                         7.66        ---------   Ventricular septum                       Value       Reference  IVS thickness, ED                        8.21  mm    ---------   LVOT                                     Value       Reference  LVOT ID, S                               20    mm    ---------  LVOT area                                3.14  cm^2  ---------  LVOT peak velocity, S                    113   cm/s  ---------  LVOT peak gradient, S                    5     mm Hg ---------   Aortic valve                             Value       Reference  Aortic valve peak velocity, S            124   cm/s  ---------  Aortic peak gradient, S                  6     mm Hg ---------  Velocity ratio, peak, LVOT/AV            0.91        ---------  Aortic valve area, peak velocity         2.86  cm^2  ---------   Aorta                                    Value       Reference  Aortic root ID, ED                       35    mm    ---------   Left atrium  Value       Reference  LA ID, A-P, ES                           37    mm    ---------  LA volume, S                             57.3  ml    ---------  LA volume, ES, 1-p A4C                   53.2  ml    ---------  LA volume, ES, 1-p A2C                   59.7  ml    ---------   Mitral valve                              Value       Reference  Mitral E-wave peak velocity              84.9  cm/s  ---------  Mitral A-wave peak velocity              81.5  cm/s  ---------  Mitral deceleration time                 227   ms    150 - 230  Mitral peak gradient, D                  3     mm Hg ---------  Mitral E/A ratio, peak                   1           ---------   Right ventricle                          Value       Reference  RV ID, ED, PLAX                          34.6  mm    19 - 38  TAPSE                                    25.4  mm    ---------   Pulmonic valve                           Value       Reference  Pulmonic valve peak velocity, S          79.5  cm/s  --------- Legend: (L)  and  (H)  mark values outside specified reference range. ------------------------------------------------------------------- Prepared and Electronically Authenticated by Adrian Blackwater, MD 2017-07-12T20:55:13  Assessment and Plan: Patient Active Problem List   Diagnosis Date Noted  . Obstructive sleep apnea 05/23/2018  . Chronic atrial fibrillation (HCC) 05/23/2018    1. Obstructive sleep apnea Continue with nightly CPAP use at current pressure settings at this time.  2. CPAP use counseling Advised to stop using cleaning machine per FDA. Discussed to clean water chamber by hand with warm water  and vinegar once weekly as well as mask and tubing clean with warm water and mild soap. Discussed importance of adequate CPAP use time.  3. Chronic atrial fibrillation (HCC) Stable at this time, continue with current therapy as well as follow-ups with PCP and cardiology as directed.  General Counseling: I have discussed the findings of the evaluation and examination with Sergio Kennedy.  I have also discussed any further diagnostic evaluation thatmay be needed or ordered today. Jmari verbalizes understanding of the findings of todays visit. We also reviewed his medications today and discussed drug interactions and side effects  including but not limited excessive drowsiness and altered mental states. We also discussed that there is always a risk not just to him but also people around him. he has been encouraged to call the office with any questions or concerns that should arise related to todays visit.  Time spent: 25  I have personally obtained a history, examined the patient, evaluated laboratory and imaging results, formulated the assessment and plan and placed orders. This patient was seen by Brent General AGNP-C in Collaboration with Dr. Freda Munro as a part of collaborative care agreement.    Yevonne Pax, MD [facility] Pulmonary and Critical Care Sleep medicine

## 2020-06-10 ENCOUNTER — Encounter: Payer: Self-pay | Admitting: Internal Medicine

## 2020-06-10 NOTE — Patient Instructions (Signed)

## 2020-11-10 ENCOUNTER — Ambulatory Visit: Payer: BC Managed Care – PPO

## 2020-11-16 ENCOUNTER — Ambulatory Visit: Payer: BC Managed Care – PPO | Admitting: Internal Medicine

## 2020-12-01 ENCOUNTER — Ambulatory Visit: Payer: BC Managed Care – PPO

## 2020-12-01 ENCOUNTER — Other Ambulatory Visit: Payer: Self-pay

## 2020-12-01 DIAGNOSIS — G4733 Obstructive sleep apnea (adult) (pediatric): Secondary | ICD-10-CM | POA: Diagnosis not present

## 2020-12-01 NOTE — Progress Notes (Signed)
95 percentile pressure 15   95th percentile leak 24.9   apnea index 0.2 /hr  apnea-hypopnea index  0.6 /hr   total days used  >4 hr 90 days  total days used <4 hr 0 days  Total compliance 100 percent  He is doing great no problems or questions at this time

## 2020-12-14 ENCOUNTER — Other Ambulatory Visit: Payer: Self-pay

## 2020-12-14 ENCOUNTER — Ambulatory Visit: Payer: BC Managed Care – PPO | Admitting: Physician Assistant

## 2020-12-14 ENCOUNTER — Encounter: Payer: Self-pay | Admitting: Physician Assistant

## 2020-12-14 DIAGNOSIS — G4733 Obstructive sleep apnea (adult) (pediatric): Secondary | ICD-10-CM | POA: Diagnosis not present

## 2020-12-14 DIAGNOSIS — I482 Chronic atrial fibrillation, unspecified: Secondary | ICD-10-CM

## 2020-12-14 DIAGNOSIS — Z7189 Other specified counseling: Secondary | ICD-10-CM | POA: Diagnosis not present

## 2020-12-14 NOTE — Progress Notes (Signed)
[facility] [address] [place], [place] [address]  Pulmonary Sleep Medicine   Office Visit Note  Patient Name: Sergio Kennedy DOB: 27-Sep-1955 MRN [id number]  Date of Service: 12/14/2020  Complaints/HPI: Pt is here for pulmonary f/u for CPAP. He reports he is doing well on his CPAP and has 100% compliance. His AHI is well controlled at 0.6. Sleeps well at night. Denies headaches, dry mouth, bloating. Feels well rested and finds great benefit in using CPAP. Pt is dealing with some sinus trouble since covid in Oct. Got taste and smell back now. He is taking an antihistamine and will use flonase as needed as well.  ROS  General: (-) fever, (-) chills, (-) night sweats, (-) weakness Skin: (-) rashes, (-) itching,. Eyes: (-) visual changes, (-) redness, (-) itching. Nose and Sinuses: (-) nasal stuffiness or itchiness, (-) postnasal drip, (-) nosebleeds, (-) sinus trouble. Mouth and Throat: (-) sore throat, (-) hoarseness. Neck: (-) swollen glands, (-) enlarged thyroid, (-) neck pain. Respiratory: - cough, (-) bloody sputum, - shortness of breath, - wheezing. Cardiovascular: - ankle swelling, (-) chest pain. Lymphatic: (-) lymph node enlargement. Neurologic: (-) numbness, (-) tingling. Psychiatric: (-) anxiety, (-) depression   Current Medication: Outpatient Encounter Medications as of 12/14/2020  Medication Sig  . aspirin EC 81 MG tablet Take 81 mg by mouth daily.  Marland Kitchen levothyroxine (SYNTHROID) 75 MCG tablet Take 75 mcg by mouth daily before breakfast.  . losartan-hydrochlorothiazide (HYZAAR) 50-12.5 MG tablet Take 1 tablet by mouth 2 (two) times daily.   No facility-administered encounter medications on file as of 12/14/2020.    Surgical History: Past Surgical History:  Procedure Laterality Date  . BACK SURGERY    . FOOT SURGERY      Medical History: Past Medical History:  Diagnosis Date  . Hypertension   . Irregular heart rhythm   . Sleep apnea     Family  History: Family History  Problem Relation Age of Onset  . Diabetes Mother   . Colon cancer Father     Social History: Social History   Socioeconomic History  . Marital status: Married    Spouse name: Not on file  . Number of children: Not on file  . Years of education: Not on file  . Highest education level: Not on file  Occupational History  . Not on file  Tobacco Use  . Smoking status: Never Smoker  . Smokeless tobacco: Never Used  Substance and Sexual Activity  . Alcohol use: No  . Drug use: Not on file  . Sexual activity: Not on file  Other Topics Concern  . Not on file  Social History Narrative  . Not on file   Social Determinants of Health   Financial Resource Strain: Not on file  Food Insecurity: Not on file  Transportation Needs: Not on file  Physical Activity: Not on file  Stress: Not on file  Social Connections: Not on file  Intimate Partner Violence: Not on file    Vital Signs: Blood pressure (!) 134/92, pulse 85, temperature 98.5 F (36.9 C), resp. rate 16, height 5\' 8"  (1.727 m), weight 216 lb 6.4 oz (98.2 kg), SpO2 99 %.  Examination: General Appearance: The patient is well-developed, well-nourished, and in no distress. Skin: Gross inspection of skin unremarkable. Head: normocephalic, no gross deformities. Eyes: no gross deformities noted. ENT: ears appear grossly normal no exudates. Neck: Supple. No thyromegaly. No LAD. Respiratory: Lungs clear to auscultation bilaterally. Cardiovascular: Normal S1 and S2  without murmur or rub. Extremities: No cyanosis. pulses are equal. Neurologic: Alert and oriented. No involuntary movements.  LABS: No results found for this or any previous visit (from the past 2160 hour(s)).  Radiology: ECHO COMPLETE  Result Date: 03/22/2016                 [facility]*                       [address]                        [place], [place] [address]                            [phone]  ------------------------------------------------------------------- Transthoracic Echocardiography Patient:    Boluwatife, Flight MR #:       [id number] Study Date: 03/22/2016 Gender:     M Age:        53 Height: Weight: BSA: Pt. Status: Room:  ATTENDING    Horton Chin, MD  ORDERING     Horton Chin, MD  PERFORMING   Horton Chin, MD  REFERRING    Horton Chin, MD  SONOGRAPHER  Cristela Blue RDCS cc: ------------------------------------------------------------------- LV EF: 70% ------------------------------------------------------------------- Indications:      Ventricular premature beats. ------------------------------------------------------------------- History:   PMH:  Irregular heart rhythm.  Risk factors: Hypertension. ------------------------------------------------------------------- Study Conclusions - Left ventricle: The cavity size was normal. Systolic function was   vigorous. The estimated ejection fraction was 70%. Wall motion   was normal; there were no regional wall motion abnormalities. - Aortic valve: There was trivial regurgitation. Valve area (Vmax):   2.86 cm^2. - Mitral valve: There was mild regurgitation. - Left atrium: The atrium was mildly dilated. - Atrial septum: No defect or patent foramen ovale was identified. Impressions: - Normal LVEF and wallmotion and mild MR. ------------------------------------------------------------------- Study data:   Study status:  Routine.  Procedure:  Transthoracic echocardiography. Image quality was adequate. Transthoracic echocardiography.  M-mode, complete 2D, spectral Doppler, and color Doppler.  Birthdate:  Patient birthdate: 02/06/1956.  Age:  Patient is 65 yr old.  Sex:  Gender: male. Blood pressure:     134/88  Patient status:  Outpatient.  Study date:  Study date: 03/22/2016. Study time: 10:12 AM. ------------------------------------------------------------------- ------------------------------------------------------------------- Left  ventricle:  The cavity size was normal. Systolic function was vigorous. The estimated ejection fraction was 70%. Wall motion was normal; there were no regional wall motion abnormalities. ------------------------------------------------------------------- Aortic valve:   Trileaflet; normal thickness leaflets. Mobility was not restricted.  Doppler:  Transvalvular velocity was within the normal range. There was no stenosis. There was trivial regurgitation.    Peak velocity ratio of LVOT to aortic valve: 0.91. Valve area (Vmax): 2.86 cm^2.    Peak gradient (S): 6 mm Hg.  ------------------------------------------------------------------- Aorta:  The aorta was normal, not dilated, and non-diseased. Aortic root: The aortic root was normal in size. ------------------------------------------------------------------- Mitral valve:   Structurally normal valve.   Mobility was not restricted.  Doppler:  Transvalvular velocity was within the normal range. There was no evidence for stenosis. There was mild regurgitation.    Peak gradient (D): 3 mm Hg. ------------------------------------------------------------------- Left atrium:  The atrium was mildly dilated. ------------------------------------------------------------------- Atrial septum:  Poorly visualized. No defect or patent foramen ovale was identified. ------------------------------------------------------------------- Pulmonary veins: Common pulmonary vein:  The Doppler velocity and flow profile were normal. ------------------------------------------------------------------- Right  ventricle:  The cavity size was normal. Wall thickness was normal. Systolic function was normal. ------------------------------------------------------------------- Pulmonic valve:    Doppler:  Transvalvular velocity was within the normal range. There was no evidence for stenosis. There was trivial regurgitation. ------------------------------------------------------------------- Tricuspid  valve:   Structurally normal valve.    Doppler: Transvalvular velocity was within the normal range. There was mild regurgitation. ------------------------------------------------------------------- Pulmonary artery:   Poorly visualized. The main pulmonary artery was normal-sized. Systolic pressure was within the normal range.  ------------------------------------------------------------------- Right atrium:  Poorly visualized. The atrium was normal in size.  ------------------------------------------------------------------- Pericardium:  The pericardium was normal in appearance. There was no pericardial effusion. ------------------------------------------------------------------- Systemic veins: Inferior vena cava: The vessel was normal in size. ------------------------------------------------------------------- Post procedure conclusions Ascending Aorta: - The aorta was normal, not dilated, and non-diseased. ------------------------------------------------------------------- Measurements  Left ventricle                           Value       Reference  LV ID, ED, PLAX chordal                  48.7  mm    43 - 52  LV ID, ES, PLAX chordal                  27    mm    23 - 38  LV fx shortening, PLAX chordal           45    %     >=29  LV PW thickness, ED                      11    mm    ---------  IVS/LV PW ratio, ED                      0.75        <=1.3  LV e&', lateral                           12.8  cm/s  ---------  LV E/e&', lateral                         6.63        ---------  LV e&', medial                            9.36  cm/s  ---------  LV E/e&', medial                          9.07        ---------  LV e&', average                           11.08 cm/s  ---------  LV E/e&', average                         7.66        ---------   Ventricular septum                       Value       Reference  IVS thickness, ED  8.21  mm    ---------   LVOT                                     Value        Reference  LVOT ID, S                               20    mm    ---------  LVOT area                                3.14  cm^2  ---------  LVOT peak velocity, S                    113   cm/s  ---------  LVOT peak gradient, S                    5     mm Hg ---------   Aortic valve                             Value       Reference  Aortic valve peak velocity, S            124   cm/s  ---------  Aortic peak gradient, S                  6     mm Hg ---------  Velocity ratio, peak, LVOT/AV            0.91        ---------  Aortic valve area, peak velocity         2.86  cm^2  ---------   Aorta                                    Value       Reference  Aortic root ID, ED                       35    mm    ---------   Left atrium                              Value       Reference  LA ID, A-P, ES                           37    mm    ---------  LA volume, S                             57.3  ml    ---------  LA volume, ES, 1-p A4C                   53.2  ml    ---------  LA volume, ES, 1-p A2C                   59.7  ml    ---------  Mitral valve                             Value       Reference  Mitral E-wave peak velocity              84.9  cm/s  ---------  Mitral A-wave peak velocity              81.5  cm/s  ---------  Mitral deceleration time                 227   ms    150 - 230  Mitral peak gradient, D                  3     mm Hg ---------  Mitral E/A ratio, peak                   1           ---------   Right ventricle                          Value       Reference  RV ID, ED, PLAX                          34.6  mm    19 - 38  TAPSE                                    25.4  mm    ---------   Pulmonic valve                           Value       Reference  Pulmonic valve peak velocity, S          79.5  cm/s  --------- Legend: (L)  and  (H)  mark values outside specified reference range. ------------------------------------------------------------------- Prepared and Electronically Authenticated by Adrian Blackwater, MD  2017-07-12T20:55:13   No results found.  No results found.    Assessment and Plan: Patient Active Problem List   Diagnosis Date Noted  . Obstructive sleep apnea 05/23/2018  . Chronic atrial fibrillation (HCC) 05/23/2018    1. Obstructive sleep apnea Continue nightly use.  2. CPAP use counseling CPAP couseling-Discussed importance of adequate CPAP use as well as proper care and cleaning techniques of machine and all supplies.  3. Chronic atrial fibrillation (HCC) Per pt this resolved and is on no medications for this. F/u with PCP and cardiology   General Counseling: I have discussed the findings of the evaluation and examination with Lazlo.  I have also discussed any further diagnostic evaluation thatmay be needed or ordered today. Royce verbalizes understanding of the findings of todays visit. We also reviewed his medications today and discussed drug interactions and side effects including but not limited excessive drowsiness and altered mental states. We also discussed that there is always a risk not just to him but also people around him. he has been encouraged to call the office with any questions or concerns that should arise related to todays visit.  No orders of the defined types were placed in this encounter.    Time spent: 30  I have personally obtained a  history, examined the patient, evaluated laboratory and imaging results, formulated the assessment and plan and placed orders. This patient was seen by Lynn ItoLauren Lebert Lovern, PA-C in collaboration with Dr. Freda MunroSaadat Khan as a part of collaborative care agreement.     Yevonne PaxSaadat A Khan, MD [facility] Pulmonary and Critical Care Sleep medicine

## 2020-12-14 NOTE — Patient Instructions (Signed)

## 2021-09-11 HISTORY — PX: OTHER SURGICAL HISTORY: SHX169

## 2021-12-09 LAB — EXTERNAL GENERIC LAB PROCEDURE: COLOGUARD: NEGATIVE

## 2021-12-09 LAB — COLOGUARD: COLOGUARD: NEGATIVE

## 2021-12-15 ENCOUNTER — Encounter: Payer: Self-pay | Admitting: Physician Assistant

## 2021-12-15 ENCOUNTER — Ambulatory Visit: Payer: BLUE CROSS/BLUE SHIELD | Admitting: Physician Assistant

## 2021-12-15 VITALS — BP 142/93 | HR 79 | Temp 97.6°F | Resp 16 | Wt 216.0 lb

## 2021-12-15 DIAGNOSIS — I10 Essential (primary) hypertension: Secondary | ICD-10-CM

## 2021-12-15 DIAGNOSIS — Z7189 Other specified counseling: Secondary | ICD-10-CM | POA: Diagnosis not present

## 2021-12-15 DIAGNOSIS — G4733 Obstructive sleep apnea (adult) (pediatric): Secondary | ICD-10-CM

## 2021-12-15 NOTE — Progress Notes (Signed)
[facility] ?[address] [place] [address] ?[place], [place] [id number] ? ?Pulmonary Sleep Medicine  ? ?Office Visit Note ? ?Patient Name: Sergio Kennedy ?DOB: 26-Dec-1955 ?MRN [id number] ? ?Date of Service: 12/23/2021 ? ?Complaints/HPI: Pt is here for routine pulmonary follow up. Using CPAP nightly. Benefiting from use. He does state he is taking Coricidin HBP for sinus concerns this past two days with the weather changes, but otherwise is doing great. His BP also has been a little elevated since sinus concerns but he is watching it. No headaches, dryness, or SOB. He cleans his machine and changes supplies regularly. Unfortunately no download is available at this time, but he is not having any problems and feels well rested. ? ?ROS ? ?General: (-) fever, (-) chills, (-) night sweats, (-) weakness ?Skin: (-) rashes, (-) itching,. ?Eyes: (-) visual changes, (-) redness, (-) itching. ?Nose and Sinuses: (-) nasal stuffiness or itchiness, (-) postnasal drip, (-) nosebleeds, (-) sinus trouble. ?Mouth and Throat: (-) sore throat, (-) hoarseness. ?Neck: (-) swollen glands, (-) enlarged thyroid, (-) neck pain. ?Respiratory: - cough, (-) bloody sputum, - shortness of breath, - wheezing. ?Cardiovascular: - ankle swelling, (-) chest pain. ?Lymphatic: (-) lymph node enlargement. ?Neurologic: (-) numbness, (-) tingling. ?Psychiatric: (-) anxiety, (-) depression ? ? ?Current Medication: ?Outpatient Encounter Medications as of 12/15/2021  ?Medication Sig  ? aspirin EC 81 MG tablet Take 81 mg by mouth daily.  ? levothyroxine (SYNTHROID) 75 MCG tablet Take 75 mcg by mouth daily before breakfast.  ? losartan-hydrochlorothiazide (HYZAAR) 50-12.5 MG tablet Take 1 tablet by mouth 2 (two) times daily.  ? ?No facility-administered encounter medications on file as of 12/15/2021.  ? ? ?Surgical History: ?Past Surgical History:  ?Procedure Laterality Date  ? BACK SURGERY    ? FOOT SURGERY    ? ? ?Medical History: ?Past Medical History:  ?Diagnosis  Date  ? Hypertension   ? Irregular heart rhythm   ? Sleep apnea   ? ? ?Family History: ?Family History  ?Problem Relation Age of Onset  ? Diabetes Mother   ? Colon cancer Father   ? ? ?Social History: ?Social History  ? ?Socioeconomic History  ? Marital status: Married  ?  Spouse name: Not on file  ? Number of children: Not on file  ? Years of education: Not on file  ? Highest education level: Not on file  ?Occupational History  ? Not on file  ?Tobacco Use  ? Smoking status: Never  ? Smokeless tobacco: Never  ?Substance and Sexual Activity  ? Alcohol use: No  ? Drug use: Not on file  ? Sexual activity: Not on file  ?Other Topics Concern  ? Not on file  ?Social History Narrative  ? Not on file  ? ?Social Determinants of Health  ? ?Financial Resource Strain: Not on file  ?Food Insecurity: Not on file  ?Transportation Needs: Not on file  ?Physical Activity: Not on file  ?Stress: Not on file  ?Social Connections: Not on file  ?Intimate Partner Violence: Not on file  ? ? ?Vital Signs: ?Blood pressure (!) 142/93, pulse 79, temperature 97.6 ?F (36.4 ?C), resp. rate 16, weight 216 lb (98 kg), SpO2 97 %. ? ?Examination: ?General Appearance: The patient is well-developed, well-nourished, and in no distress. ?Skin: Gross inspection of skin unremarkable. ?Head: normocephalic, no gross deformities. ?Eyes: no gross deformities noted. ?ENT: ears appear grossly normal no exudates. ?Neck: Supple. No thyromegaly. No LAD. ?Respiratory: Lungs clear to auscultation bilaterally. ?Cardiovascular: Normal S1 and S2  without murmur or rub. ?Extremities: No cyanosis. pulses are equal. ?Neurologic: Alert and oriented. No involuntary movements. ? ?LABS: ?No results found for this or any previous visit (from the past 2160 hour(s)). ? ?Radiology: ?ECHO COMPLETE ? ?Result Date: 03/22/2016 ?                [facility]*                       [address]                        [place], [place] [address]                             [phone] ------------------------------------------------------------------- Transthoracic Echocardiography Patient:    Sergio, Kennedy MR #:       [id number] Study Date: 03/22/2016 Gender:     M Age:        51 Height: Weight: BSA: Pt. Status: Room:  ATTENDING    Horton Chin, MD  ORDERING     Horton Chin, MD  PERFORMING   Horton Chin, MD  REFERRING    Horton Chin, MD  SONOGRAPHER  Cristela Blue RDCS cc: ------------------------------------------------------------------- LV EF: 70% ------------------------------------------------------------------- Indications:      Ventricular premature beats. ------------------------------------------------------------------- History:   PMH:  Irregular heart rhythm.  Risk factors: Hypertension. ------------------------------------------------------------------- Study Conclusions - Left ventricle: The cavity size was normal. Systolic function was   vigorous. The estimated ejection fraction was 70%. Wall motion   was normal; there were no regional wall motion abnormalities. - Aortic valve: There was trivial regurgitation. Valve area (Vmax):   2.86 cm^2. - Mitral valve: There was mild regurgitation. - Left atrium: The atrium was mildly dilated. - Atrial septum: No defect or patent foramen ovale was identified. Impressions: - Normal LVEF and wallmotion and mild MR. ------------------------------------------------------------------- Study data:   Study status:  Routine.  Procedure:  Transthoracic echocardiography. Image quality was adequate. Transthoracic echocardiography.  M-mode, complete 2D, spectral Doppler, and color Doppler.  Birthdate:  Patient birthdate: 05-13-1956.  Age:  Patient is 66 yr old.  Sex:  Gender: male. Blood pressure:     134/88  Patient status:  Outpatient.  Study date:  Study date: 03/22/2016. Study time: 10:12 AM. -------------------------------------------------------------------  ------------------------------------------------------------------- Left ventricle:  The cavity size was normal. Systolic function was vigorous. The estimated ejection fraction was 70%. Wall motion was normal; there were no regional wall motion abnormalities. ------------------------------------------------------------------- Aortic valve:   Trileaflet; normal thickness leaflets. Mobility was not restricted.  Doppler:  Transvalvular velocity was within the normal range. There was no stenosis. There was trivial regurgitation.    Peak velocity ratio of LVOT to aortic valve: 0.91. Valve area (Vmax): 2.86 cm^2.    Peak gradient (S): 6 mm Hg.  ------------------------------------------------------------------- Aorta:  The aorta was normal, not dilated, and non-diseased. Aortic root: The aortic root was normal in size. ------------------------------------------------------------------- Mitral valve:   Structurally normal valve.   Mobility was not restricted.  Doppler:  Transvalvular velocity was within the normal range. There was no evidence for stenosis. There was mild regurgitation.    Peak gradient (D): 3 mm Hg. ------------------------------------------------------------------- Left atrium:  The atrium was mildly dilated. ------------------------------------------------------------------- Atrial septum:  Poorly visualized. No defect or patent foramen ovale was identified. ------------------------------------------------------------------- Pulmonary veins: Common pulmonary vein:  The Doppler velocity and flow profile were normal. ------------------------------------------------------------------- Right  ventricle:  The cavity size was normal. Wall thickness was normal. Systolic function was normal. ------------------------------------------------------------------- Pulmonic valve:    Doppler:  Transvalvular velocity was within the normal range. There was no evidence for stenosis. There was trivial regurgitation.  ------------------------------------------------------------------- Tricuspid valve:   Structurally normal valve.    Doppler: Transvalvular velocity was within the normal range. There was mild regurgitation. ---------------------------------------------------------------

## 2021-12-23 NOTE — Patient Instructions (Signed)

## 2022-01-04 ENCOUNTER — Ambulatory Visit: Payer: BLUE CROSS/BLUE SHIELD

## 2022-01-25 ENCOUNTER — Ambulatory Visit (INDEPENDENT_AMBULATORY_CARE_PROVIDER_SITE_OTHER): Payer: BLUE CROSS/BLUE SHIELD

## 2022-01-25 DIAGNOSIS — G4733 Obstructive sleep apnea (adult) (pediatric): Secondary | ICD-10-CM | POA: Diagnosis not present

## 2022-01-25 NOTE — Progress Notes (Signed)
95 percentile pressure 15 ? ? 95th percentile leak 24.9 ? ? apnea index 0.2 /hr ? apnea-hypopnea index  0.6 /hr ? ? total days used  >4 hr 90 days ? total days used <4 hr 0 days ? ?Total compliance 100 percent ? ?He is doing great no problems or questions at this time  ?Pt was seen by Tresa Endo  RRT/RCP  from [facility]  ? ?

## 2022-03-07 ENCOUNTER — Telehealth: Payer: Self-pay

## 2022-03-08 ENCOUNTER — Telehealth: Payer: Self-pay

## 2022-03-08 NOTE — Telephone Encounter (Signed)
Pt called that he need cpap supply  gave kelly  order to cpap supply and also advised pt that we gave order to [facility] and they will run to insurance and call him

## 2022-07-17 ENCOUNTER — Other Ambulatory Visit: Payer: Self-pay

## 2022-07-17 ENCOUNTER — Emergency Department: Payer: BLUE CROSS/BLUE SHIELD

## 2022-07-17 ENCOUNTER — Emergency Department
Admission: EM | Admit: 2022-07-17 | Discharge: 2022-07-17 | Disposition: A | Discharge status: Home / Self Care | Payer: BLUE CROSS/BLUE SHIELD | Attending: Emergency Medicine | Admitting: Emergency Medicine

## 2022-07-17 ENCOUNTER — Encounter: Payer: Self-pay | Admitting: Intensive Care

## 2022-07-17 DIAGNOSIS — R42 Dizziness and giddiness: Secondary | ICD-10-CM | POA: Insufficient documentation

## 2022-07-17 DIAGNOSIS — R55 Syncope and collapse: Secondary | ICD-10-CM | POA: Insufficient documentation

## 2022-07-17 HISTORY — DX: Disorder of thyroid, unspecified: E07.9

## 2022-07-17 LAB — CBC
HCT: 40.7 % (ref 39.0–52.0)
Hemoglobin: 14.4 g/dL (ref 13.0–17.0)
MCH: 32.2 pg (ref 26.0–34.0)
MCHC: 35.4 g/dL (ref 30.0–36.0)
MCV: 91.1 fL (ref 80.0–100.0)
Platelets: 214 10*3/uL (ref 150–400)
RBC: 4.47 MIL/uL (ref 4.22–5.81)
RDW: 11.9 % (ref 11.5–15.5)
WBC: 5.8 10*3/uL (ref 4.0–10.5)
nRBC: 0 % (ref 0.0–0.2)

## 2022-07-17 LAB — BASIC METABOLIC PANEL
Anion gap: 8 (ref 5–15)
BUN: 16 mg/dL (ref 8–23)
CO2: 28 mmol/L (ref 22–32)
Calcium: 9.6 mg/dL (ref 8.9–10.3)
Chloride: 106 mmol/L (ref 98–111)
Creatinine, Ser: 0.99 mg/dL (ref 0.61–1.24)
GFR, Estimated: >60 mL/min (ref >60)
Glucose, Bld: 128 mg/dL — ABNORMAL HIGH (ref 70–99)
Potassium: 4 mmol/L (ref 3.5–5.1)
Sodium: 142 mmol/L (ref 135–145)

## 2022-07-17 LAB — TROPONIN I (HIGH SENSITIVITY): Troponin I (High Sensitivity): 8 ng/L (ref <18)

## 2022-07-17 NOTE — ED Triage Notes (Signed)
Patient reports some intermittent dizziness since getting hit in head with tree limb a couple weeks ago. Today while at work patient had near syncope episode after feeling lightheaded/dizzy.   A&O x4 in triage.  Reports seeing his pcp after getting hit in head but never had any scans.

## 2022-07-17 NOTE — ED Provider Notes (Signed)
[facility] Provider Note    Event Date/Time   First MD Initiated Contact with Patient 07/17/22 1201     (approximate)   History   Near Syncope   HPI  Sergio Kennedy is a 66 y.o. male who presents with complaints of dizziness, near syncope.  Patient reports several weeks ago a heavy tree limb fell on his head while he was cutting it, since then he has had intermittent periods of dizziness.  Today this occurred while he was working, he does admit that he was lifting more things than typical and he became very lightheaded.  He is feeling better now.  No chest pain or palpitations.  No shortness of breath.  No headache.  No neurodeficits.     Physical Exam   Triage Vital Signs: ED Triage Vitals  Enc Vitals Group     BP 07/17/22 1053 (!) 165/98     Pulse Rate 07/17/22 1053 82     Resp 07/17/22 1053 16     Temp 07/17/22 1053 97.8 F (36.6 C)     Temp Source 07/17/22 1053 Oral     SpO2 07/17/22 1053 94 %     Weight 07/17/22 1047 94.3 kg (208 lb)     Height 07/17/22 1047 1.727 m (5\' 8" )     Head Circumference --      Peak Flow --      Pain Score 07/17/22 1047 0     Pain Loc --      Pain Edu? --      Excl. in GC? --     Most recent vital signs: Vitals:   07/17/22 1202 07/17/22 1400  BP: (!) 160/98 (!) 150/90  Pulse: 78 80  Resp: 16 16  Temp: 98.1 F (36.7 C)   SpO2: 95% 95%     General: Awake, no distress.  CV:  Good peripheral perfusion.  Resp:  Normal effort.  Abd:  No distention.  Other:  Cranial nerves II through XII are normal   ED Results / Procedures / Treatments   Labs (all labs ordered are listed, but only abnormal results are displayed) Labs Reviewed  BASIC METABOLIC PANEL - Abnormal; Notable for the following components:      Result Value   Glucose, Bld 128 (*)    All other components within normal limits  CBC  TROPONIN I (HIGH SENSITIVITY)     EKG  ED ECG REPORT I, 13/06/23, the attending physician,  personally viewed and interpreted this ECG.  Date: 07/17/2022  Rhythm: normal sinus rhythm QRS Axis: normal Intervals: normal ST/T Wave abnormalities: Nonspecific changes Narrative Interpretation: no evidence of acute ischemia    RADIOLOGY CT head viewed interpreted by me, no acute abnormality    PROCEDURES:  Critical Care performed:   Procedures   MEDICATIONS ORDERED IN ED: Medications - No data to display   IMPRESSION / MDM / ASSESSMENT AND PLAN / ED COURSE  I reviewed the triage vital signs and the nursing notes. Patient's presentation is most consistent with acute presentation with potential threat to life or bodily function.   Patient presents with episode of dizziness, lightheadedness as detailed above.  Differential includes arrhythmia, dehydration, neurologic event, electrolyte abnormality  Lab work is overall quite reassuring, EKG is unremarkable.  CT head and CT cervical spine appear normal  Patient reports he feels a sense of needing to turn to the left while walking, given this MRI ordered to rule out CVA.  MRI was  reassuring, appropriate for discharge with outpatient follow-up       FINAL CLINICAL IMPRESSION(S) / ED DIAGNOSES   Final diagnoses:  Near syncope     Rx / DC Orders   ED Discharge Orders     None        Note:  This document was prepared using Dragon voice recognition software and may include unintentional dictation errors.   Lavonia Drafts, MD 07/17/22 [phone]

## 2022-07-17 NOTE — Discharge Instructions (Signed)
Your CT head, cervical spine and MRI brain were all quite reassuring

## 2022-12-13 ENCOUNTER — Ambulatory Visit (INDEPENDENT_AMBULATORY_CARE_PROVIDER_SITE_OTHER): Payer: BLUE CROSS/BLUE SHIELD

## 2022-12-13 DIAGNOSIS — G4733 Obstructive sleep apnea (adult) (pediatric): Secondary | ICD-10-CM

## 2022-12-13 NOTE — Progress Notes (Signed)
95 percentile pressure 15   95th percentile leak 26.6   apnea index 0.1 /hr  apnea-hypopnea index  0.4 /hr   total days used  >4 hr 90 days  total days used <4 hr 0 days  Total compliance 100 percent  He is doing great, no problems or questions at this time.    Pt was seen by Claiborne Billings  RRT/RCP  from [facility]

## 2022-12-21 ENCOUNTER — Ambulatory Visit (INDEPENDENT_AMBULATORY_CARE_PROVIDER_SITE_OTHER): Payer: BLUE CROSS/BLUE SHIELD | Admitting: Physician Assistant

## 2022-12-21 ENCOUNTER — Encounter: Payer: Self-pay | Admitting: Physician Assistant

## 2022-12-21 VITALS — BP 146/87 | HR 89 | Temp 97.6°F | Resp 16 | Ht 68.0 in | Wt 222.2 lb

## 2022-12-21 DIAGNOSIS — G4733 Obstructive sleep apnea (adult) (pediatric): Secondary | ICD-10-CM

## 2022-12-21 DIAGNOSIS — Z7189 Other specified counseling: Secondary | ICD-10-CM | POA: Diagnosis not present

## 2022-12-21 DIAGNOSIS — I10 Essential (primary) hypertension: Secondary | ICD-10-CM

## 2022-12-21 NOTE — Progress Notes (Signed)
[facility] [address] [place], [place] [address]  Pulmonary Sleep Medicine   Office Visit Note  Patient Name: Sergio Kennedy DOB: 11-16-1955 MRN [id number]  Date of Service: 12/21/2022  Complaints/HPI: Pt is here for routine follow up for OSA on CPAP. Doing well with CPAP and wears nightly. He is benefiting from use. Reports having a back up machine he was given by a friend and has been set to his pressures. He changes supplies regularly and keeps it clean. Denies SOB, dryness, or headaches. His compliance is excellent and his AHI is well controlled.  CPAP Download on 12/13/22:  95 percentile pressure 15  95th percentile leak 26.6  apnea index 0.1 /hr  apnea-hypopnea index  0.4 /hr  total days used  >4 hr 90 days  total days used <4 hr 0 days Total compliance 100 percent   ROS  General: (-) fever, (-) chills, (-) night sweats, (-) weakness Skin: (-) rashes, (-) itching,. Eyes: (-) visual changes, (-) redness, (-) itching. Nose and Sinuses: (-) nasal stuffiness or itchiness, (-) postnasal drip, (-) nosebleeds, (-) sinus trouble. Mouth and Throat: (-) sore throat, (-) hoarseness. Neck: (-) swollen glands, (-) enlarged thyroid, (-) neck pain. Respiratory: - cough, (-) bloody sputum, - shortness of breath, - wheezing. Cardiovascular: - ankle swelling, (-) chest pain. Lymphatic: (-) lymph node enlargement. Neurologic: (-) numbness, (-) tingling. Psychiatric: (-) anxiety, (-) depression   Current Medication: Outpatient Encounter Medications as of 12/21/2022  Medication Sig   aspirin EC 81 MG tablet Take 81 mg by mouth daily.   levothyroxine (SYNTHROID) 75 MCG tablet Take 75 mcg by mouth daily before breakfast.   losartan-hydrochlorothiazide (HYZAAR) 50-12.5 MG tablet Take 1 tablet by mouth 2 (two) times daily.   No facility-administered encounter medications on file as of 12/21/2022.    Surgical History: Past Surgical History:  Procedure Laterality Date    BACK SURGERY     FOOT SURGERY      Medical History: Past Medical History:  Diagnosis Date   Hypertension    Irregular heart rhythm    Sleep apnea    Thyroid disease     Family History: Family History  Problem Relation Age of Onset   Diabetes Mother    Colon cancer Father     Social History: Social History   Socioeconomic History   Marital status: Divorced    Spouse name: Not on file   Number of children: Not on file   Years of education: Not on file   Highest education level: Not on file  Occupational History   Not on file  Tobacco Use   Smoking status: Never   Smokeless tobacco: Never  Vaping Use   Vaping Use: Never used  Substance and Sexual Activity   Alcohol use: No   Drug use: Not on file   Sexual activity: Not on file  Other Topics Concern   Not on file  Social History Narrative   Not on file   Social Determinants of Health   Financial Resource Strain: Not on file  Food Insecurity: Not on file  Transportation Needs: Not on file  Physical Activity: Not on file  Stress: Not on file  Social Connections: Not on file  Intimate Partner Violence: Not on file    Vital Signs: Blood pressure (!) 146/87, pulse 89, temperature 97.6 F (36.4 C), resp. rate 16, height 5\' 8"  (1.727 m), weight 222 lb 3.2 oz (100.8 kg), SpO2 97 %.  Examination: General Appearance: The  patient is well-developed, well-nourished, and in no distress. Skin: Gross inspection of skin unremarkable. Head: normocephalic, no gross deformities. Eyes: no gross deformities noted. ENT: ears appear grossly normal no exudates. Neck: Supple. No thyromegaly. No LAD. Respiratory: Lungs clear to auscultation bilaterally. Cardiovascular: Normal S1 and S2 without murmur or rub. Extremities: No cyanosis. pulses are equal. Neurologic: Alert and oriented. No involuntary movements.  LABS: No results found for this or any previous visit (from the past 2160 hour(s)).  Radiology: CT Cervical Spine  Wo Contrast  Result Date: 07/17/2022 CLINICAL DATA:  Trauma, dizziness, near syncope EXAM: CT CERVICAL SPINE WITHOUT CONTRAST TECHNIQUE: Multidetector CT imaging of the cervical spine was performed without intravenous contrast. Multiplanar CT image reconstructions were also generated. RADIATION DOSE REDUCTION: This exam was performed according to the departmental dose-optimization program which includes automated exposure control, adjustment of the mA and/or kV according to patient size and/or use of iterative reconstruction technique. COMPARISON:  None Available. FINDINGS: Alignment: Alignment of posterior margins of vertebral bodies appears normal. Skull base and vertebrae: No recent fracture is seen. Degenerative changes are noted with bony spurs at multiple levels. Soft tissues and spinal canal: There is extrinsic pressure over the ventral margin of thecal sac caused by posterior bony spurs, more so at C5-C6 level. Disc levels: There is narrowing of neural foramina caused by bony spurs and facet hypertrophy from C3 to T1 levels. Upper chest: Unremarkable. Other: Scattered arterial calcifications are seen. IMPRESSION: No recent fracture is seen in cervical spine. 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 Electronically Signed   By: Ernie AvenaPalani  Rathinasamy M.D.   On: 07/17/2022 13:51   MR BRAIN WO CONTRAST  Result Date: 07/17/2022 CLINICAL DATA:  Intermittent dizziness since getting hit in the head with a tree limb a few weeks ago. EXAM: MRI HEAD WITHOUT CONTRAST TECHNIQUE: Multiplanar, multiecho pulse sequences of the brain and surrounding structures were obtained without intravenous contrast. COMPARISON:  Same-day CT head FINDINGS: Brain: There is no acute intracranial hemorrhage, extra-axial fluid collection, or acute infarct Parenchymal volume is normal. The ventricles are normal in size. Gray-white differentiation is preserved. Parenchymal signal is normal with no  background white matter disease. The pituitary and other midline structures are normal. There is no mass lesion.  There is no mass effect or midline shift. Vascular: Normal flow voids. Skull and upper cervical spine: Normal marrow signal. Sinuses/Orbits: The paranasal sinuses are clear. The globes and orbits are unremarkable. Other: None. IMPRESSION: Normal brain MRI. Electronically Signed   By: Lesia HausenPeter  Noone M.D.   On: 07/17/2022 13:34   CT HEAD WO CONTRAST  Result Date: 07/17/2022 CLINICAL DATA:  Intermittent dizziness following head strike a few weeks ago. EXAM: CT HEAD WITHOUT CONTRAST TECHNIQUE: Contiguous axial images were obtained from the base of the skull through the vertex without intravenous contrast. RADIATION DOSE REDUCTION: This exam was performed according to the departmental dose-optimization program which includes automated exposure control, adjustment of the mA and/or kV according to patient size and/or use of iterative reconstruction technique. COMPARISON:  CT head 03/10/2016 FINDINGS: Brain: There is no acute intracranial hemorrhage, extra-axial fluid collection, or acute infarct Parenchymal volume is normal. The ventricles are normal in size. Gray-white differentiation is preserved There is no mass lesion.  There is no mass effect or midline shift. Vascular: No hyperdense vessel or unexpected calcification. Skull: Normal. Negative for fracture or focal lesion. Sinuses/Orbits: The imaged paranasal sinuses are clear. The globes and orbits are unremarkable. Other: None. IMPRESSION: Normal  head CT. Electronically Signed   By: Lesia Hausen M.D.   On: 07/17/2022 11:21    No results found.  No results found.    Assessment and Plan: Patient Active Problem List   Diagnosis Date Noted   Obstructive sleep apnea 05/23/2018   Chronic atrial fibrillation 05/23/2018   1. OSA (obstructive sleep apnea) Continue excellent compliance, will send order for supplies - For home use only DME  continuous positive airway pressure (CPAP)  2. CPAP use counseling CPAP couseling-Discussed importance of adequate CPAP use as well as proper care and cleaning techniques of machine and all supplies.  3. Essential hypertension Borderline elevated, Continue current medication and f/u with PCP.   General Counseling: I have discussed the findings of the evaluation and examination with Dominion.  I have also discussed any further diagnostic evaluation thatmay be needed or ordered today. Kavish verbalizes understanding of the findings of todays visit. We also reviewed his medications today and discussed drug interactions and side effects including but not limited excessive drowsiness and altered mental states. We also discussed that there is always a risk not just to him but also people around him. he has been encouraged to call the office with any questions or concerns that should arise related to todays visit.  Orders Placed This Encounter  Procedures   For home use only DME continuous positive airway pressure (CPAP)    Supplies only, [facility]    Order Specific Question:   Length of Need    Answer:   Lifetime    Order Specific Question:   Patient has OSA or probable OSA    Answer:   Yes    Order Specific Question:   Is the patient currently using CPAP in the home    Answer:   Yes    Order Specific Question:   Settings    Answer:   Other see comments    Order Specific Question:   CPAP supplies needed    Answer:   Mask, headgear, cushions, filters, heated tubing and water chamber     Time spent: 30  I have personally obtained a history, examined the patient, evaluated laboratory and imaging results, formulated the assessment and plan and placed orders. This patient was seen by Lynn Ito, PA-C in collaboration with Dr. Freda Munro as a part of collaborative care agreement.     Yevonne Pax, MD [facility] Pulmonary and Critical Care Sleep medicine

## 2022-12-22 ENCOUNTER — Telehealth: Payer: Self-pay | Admitting: Physician Assistant

## 2022-12-22 NOTE — Telephone Encounter (Signed)
Notified Beth and Sarah with [facility] of cpap supply order-Toni

## 2023-12-12 ENCOUNTER — Ambulatory Visit: Payer: Medicare HMO

## 2023-12-21 ENCOUNTER — Telehealth: Payer: Self-pay | Admitting: Physician Assistant

## 2023-12-21 ENCOUNTER — Ambulatory Visit: Payer: Medicare HMO | Admitting: Physician Assistant

## 2023-12-21 ENCOUNTER — Encounter: Payer: Self-pay | Admitting: Physician Assistant

## 2023-12-21 VITALS — BP 143/85 | HR 77 | Temp 98.4°F | Resp 16 | Ht 68.0 in | Wt 219.0 lb

## 2023-12-21 DIAGNOSIS — I10 Essential (primary) hypertension: Secondary | ICD-10-CM | POA: Diagnosis not present

## 2023-12-21 DIAGNOSIS — Z7189 Other specified counseling: Secondary | ICD-10-CM | POA: Diagnosis not present

## 2023-12-21 DIAGNOSIS — G4733 Obstructive sleep apnea (adult) (pediatric): Secondary | ICD-10-CM | POA: Diagnosis not present

## 2023-12-21 NOTE — Patient Instructions (Signed)

## 2023-12-21 NOTE — Telephone Encounter (Signed)
Notified Beth & Sarah w/ [facility] of cpap supply order-Toni

## 2023-12-21 NOTE — Progress Notes (Addendum)
 [facility] [address] [place], [place] [address]  Pulmonary Sleep Medicine   Office Visit Note  Patient Name: Sergio Kennedy DOB: Sep 29, 1955 MRN [id number]  Date of Service: 12/21/2023  Complaints/HPI: Pt is here for routine pulmonary follow up for OSA on CPAP. He is due for supplies and needs new order. Keeping everything clean. Denies dryness, SOB, or headache. Using and benefiting from PAP. Has a back up machine from a friend in case he ever needs it, but is happy with current machine and is still working. Already set to his pressure.   CPAP compliance download 10/24/23-01/21/24 Use 90/90 days >4 hours 100% Avg use 7hrs Settings CPAP 15 cm H2O Leak 95th 7.7 AHI 0.4  ROS  General: (-) fever, (-) chills, (-) night sweats, (-) weakness Skin: (-) rashes, (-) itching,. Eyes: (-) visual changes, (-) redness, (-) itching. Nose and Sinuses: (-) nasal stuffiness or itchiness, (-) postnasal drip, (-) nosebleeds, (-) sinus trouble. Mouth and Throat: (-) sore throat, (-) hoarseness. Neck: (-) swollen glands, (-) enlarged thyroid, (-) neck pain. Respiratory: - cough, (-) bloody sputum, - shortness of breath, - wheezing. Cardiovascular: - ankle swelling, (-) chest pain. Lymphatic: (-) lymph node enlargement. Neurologic: (-) numbness, (-) tingling. Psychiatric: (-) anxiety, (-) depression   Current Medication: Outpatient Encounter Medications as of 12/21/2023  Medication Sig   aspirin EC 81 MG tablet Take 81 mg by mouth daily.   levothyroxine (SYNTHROID) 75 MCG tablet Take 75 mcg by mouth daily before breakfast.   losartan-hydrochlorothiazide (HYZAAR) 50-12.5 MG tablet Take 1 tablet by mouth 2 (two) times daily.   No facility-administered encounter medications on file as of 12/21/2023.    Surgical History: Past Surgical History:  Procedure Laterality Date   BACK SURGERY     FOOT SURGERY      Medical History: Past Medical History:  Diagnosis Date    Hypertension    Irregular heart rhythm    Sleep apnea    Thyroid disease     Family History: Family History  Problem Relation Age of Onset   Diabetes Mother    Colon cancer Father     Social History: Social History   Socioeconomic History   Marital status: Divorced    Spouse name: Not on file   Number of children: Not on file   Years of education: Not on file   Highest education level: Not on file  Occupational History   Not on file  Tobacco Use   Smoking status: Never   Smokeless tobacco: Never  Vaping Use   Vaping status: Never Used  Substance and Sexual Activity   Alcohol use: No   Drug use: Not on file   Sexual activity: Not on file  Other Topics Concern   Not on file  Social History Narrative   Not on file   Social Drivers of Health   Financial Resource Strain: Low Risk  (09/20/2023)   Received from [facility]   Overall Financial Resource Strain (CARDIA)    Difficulty of Paying Living Expenses: Not hard at all  Food Insecurity: No Food Insecurity (09/20/2023)   Received from [facility]   Hunger Vital Sign    Worried About Running Out of Food in the Last Year: Never true    Ran Out of Food in the Last Year: Never true  Transportation Needs: No Transportation Needs (09/20/2023)   Received from [facility]   [facility] - Transportation  In the past 12 months, has lack of transportation kept you from medical appointments or from getting medications?: No    Lack of Transportation (Non-Medical): No  Physical Activity: Not on file  Stress: Not on file  Social Connections: Not on file  Intimate Partner Violence: Not on file    Vital Signs: Blood pressure (!) 143/85, pulse 77, temperature 98.4 F (36.9 C), resp. rate 16, height 5\' 8"  (1.727 m), weight 219 lb (99.3 kg), SpO2 98%.  Examination: General Appearance: The patient is well-developed, well-nourished, and in no distress. Skin: Gross inspection of  skin unremarkable. Head: normocephalic, no gross deformities. Eyes: no gross deformities noted. ENT: ears appear grossly normal no exudates. Neck: Supple. No thyromegaly. No LAD. Respiratory: Lungs clear to auscultation. Cardiovascular: Normal S1 and S2 without murmur or rub. Extremities: No cyanosis. pulses are equal. Neurologic: Alert and oriented. No involuntary movements.  LABS: No results found for this or any previous visit (from the past 2160 hours).  Radiology: CT Cervical Spine Wo Contrast Result Date: 07/17/2022 CLINICAL DATA:  Trauma, dizziness, near syncope EXAM: CT CERVICAL SPINE WITHOUT CONTRAST TECHNIQUE: Multidetector CT imaging of the cervical spine was performed without intravenous contrast. Multiplanar CT image reconstructions were also generated. RADIATION DOSE REDUCTION: This exam was performed according to the departmental dose-optimization program which includes automated exposure control, adjustment of the mA and/or kV according to patient size and/or use of iterative reconstruction technique. COMPARISON:  None Available. FINDINGS: Alignment: Alignment of posterior margins of vertebral bodies appears normal. Skull base and vertebrae: No recent fracture is seen. Degenerative changes are noted with bony spurs at multiple levels. Soft tissues and spinal canal: There is extrinsic pressure over the ventral margin of thecal sac caused by posterior bony spurs, more so at C5-C6 level. Disc levels: There is narrowing of neural foramina caused by bony spurs and facet hypertrophy from C3 to T1 levels. Upper chest: Unremarkable. Other: Scattered arterial calcifications are seen. IMPRESSION: No recent fracture is seen in cervical spine. 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 Electronically Signed   By: Craven Do M.D.   On: 07/17/2022 13:51   MR BRAIN WO CONTRAST Result Date: 07/17/2022 CLINICAL DATA:  Intermittent dizziness since getting  hit in the head with a tree limb a few weeks ago. EXAM: MRI HEAD WITHOUT CONTRAST TECHNIQUE: Multiplanar, multiecho pulse sequences of the brain and surrounding structures were obtained without intravenous contrast. COMPARISON:  Same-day CT head FINDINGS: Brain: There is no acute intracranial hemorrhage, extra-axial fluid collection, or acute infarct Parenchymal volume is normal. The ventricles are normal in size. Gray-white differentiation is preserved. Parenchymal signal is normal with no background white matter disease. The pituitary and other midline structures are normal. There is no mass lesion.  There is no mass effect or midline shift. Vascular: Normal flow voids. Skull and upper cervical spine: Normal marrow signal. Sinuses/Orbits: The paranasal sinuses are clear. The globes and orbits are unremarkable. Other: None. IMPRESSION: Normal brain MRI. Electronically Signed   By: Eldora Greet M.D.   On: 07/17/2022 13:34   CT HEAD WO CONTRAST Result Date: 07/17/2022 CLINICAL DATA:  Intermittent dizziness following head strike a few weeks ago. EXAM: CT HEAD WITHOUT CONTRAST TECHNIQUE: Contiguous axial images were obtained from the base of the skull through the vertex without intravenous contrast. RADIATION DOSE REDUCTION: This exam was performed according to the departmental dose-optimization program which includes automated exposure control, adjustment of the mA and/or kV according to patient size and/or use  of iterative reconstruction technique. COMPARISON:  CT head 03/10/2016 FINDINGS: Brain: There is no acute intracranial hemorrhage, extra-axial fluid collection, or acute infarct Parenchymal volume is normal. The ventricles are normal in size. Gray-white differentiation is preserved There is no mass lesion.  There is no mass effect or midline shift. Vascular: No hyperdense vessel or unexpected calcification. Skull: Normal. Negative for fracture or focal lesion. Sinuses/Orbits: The imaged paranasal sinuses are  clear. The globes and orbits are unremarkable. Other: None. IMPRESSION: Normal head CT. Electronically Signed   By: Eldora Greet M.D.   On: 07/17/2022 11:21    No results found.  No results found.    Assessment and Plan: Patient Active Problem List   Diagnosis Date Noted   Obstructive sleep apnea 05/23/2018   Chronic atrial fibrillation (HCC) 05/23/2018   1. OSA (obstructive sleep apnea) (Primary) Continue excellent compliance - For home use only DME continuous positive airway pressure (CPAP)  2. CPAP use counseling CPAP couseling-Discussed importance of adequate CPAP use as well as proper care and cleaning techniques of machine and all supplies.  3. Essential hypertension Continue current medication and f/u with PCP.    General Counseling: I have discussed the findings of the evaluation and examination with Athony.  I have also discussed any further diagnostic evaluation thatmay be needed or ordered today. Hartwell verbalizes understanding of the findings of todays visit. We also reviewed his medications today and discussed drug interactions and side effects including but not limited excessive drowsiness and altered mental states. We also discussed that there is always a risk not just to him but also people around him. he has been encouraged to call the office with any questions or concerns that should arise related to todays visit.  Orders Placed This Encounter  Procedures   For home use only DME continuous positive airway pressure (CPAP)    Needs all supplies, [facility]    Length of Need:   Lifetime    Patient has OSA or probable OSA:   Yes    Is the patient currently using CPAP in the home:   Yes    Settings:   Other see comments    CPAP supplies needed:   Mask, headgear, cushions, filters, heated tubing and water chamber    Additional equipment included:   Heated humification and supplies     Time spent: 30  I have personally obtained a history, examined the patient, evaluated  laboratory and imaging results, formulated the assessment and plan and placed orders. This patient was seen by Taylor Favia, PA-C in collaboration with Dr. Cam Cava as a part of collaborative care agreement.     Cordie Deters, MD [facility] Pulmonary and Critical Care Sleep medicine

## 2024-01-22 ENCOUNTER — Telehealth: Payer: Self-pay | Admitting: Physician Assistant

## 2024-01-22 NOTE — Telephone Encounter (Signed)
 Sent message back to [facility] for update on supplies. Notified them patient's compliance download in "media"-Toni

## 2024-02-15 ENCOUNTER — Encounter
Admission: RE | Disposition: A | Discharge status: Home / Self Care | Payer: Self-pay | Source: Ambulatory Visit | Attending: Gastroenterology

## 2024-02-15 ENCOUNTER — Ambulatory Visit: Admitting: Certified Registered"

## 2024-02-15 ENCOUNTER — Ambulatory Visit
Admission: RE | Admit: 2024-02-15 | Discharge: 2024-02-15 | Disposition: A | Discharge status: Home / Self Care | Source: Ambulatory Visit | Attending: Gastroenterology | Admitting: Gastroenterology

## 2024-02-15 ENCOUNTER — Encounter: Payer: Self-pay | Admitting: Gastroenterology

## 2024-02-15 DIAGNOSIS — Z1211 Encounter for screening for malignant neoplasm of colon: Secondary | ICD-10-CM | POA: Insufficient documentation

## 2024-02-15 DIAGNOSIS — E039 Hypothyroidism, unspecified: Secondary | ICD-10-CM | POA: Insufficient documentation

## 2024-02-15 DIAGNOSIS — D120 Benign neoplasm of cecum: Secondary | ICD-10-CM | POA: Diagnosis not present

## 2024-02-15 DIAGNOSIS — Z800 Family history of malignant neoplasm of digestive organs: Secondary | ICD-10-CM | POA: Insufficient documentation

## 2024-02-15 DIAGNOSIS — D125 Benign neoplasm of sigmoid colon: Secondary | ICD-10-CM | POA: Insufficient documentation

## 2024-02-15 DIAGNOSIS — D123 Benign neoplasm of transverse colon: Secondary | ICD-10-CM | POA: Insufficient documentation

## 2024-02-15 DIAGNOSIS — K640 First degree hemorrhoids: Secondary | ICD-10-CM | POA: Diagnosis not present

## 2024-02-15 DIAGNOSIS — I10 Essential (primary) hypertension: Secondary | ICD-10-CM | POA: Insufficient documentation

## 2024-02-15 DIAGNOSIS — K621 Rectal polyp: Secondary | ICD-10-CM | POA: Insufficient documentation

## 2024-02-15 DIAGNOSIS — D122 Benign neoplasm of ascending colon: Secondary | ICD-10-CM | POA: Insufficient documentation

## 2024-02-15 DIAGNOSIS — K5730 Diverticulosis of large intestine without perforation or abscess without bleeding: Secondary | ICD-10-CM | POA: Diagnosis not present

## 2024-02-15 DIAGNOSIS — Z83719 Family history of colon polyps, unspecified: Secondary | ICD-10-CM | POA: Diagnosis not present

## 2024-02-15 DIAGNOSIS — G4730 Sleep apnea, unspecified: Secondary | ICD-10-CM | POA: Insufficient documentation

## 2024-02-15 HISTORY — PX: POLYPECTOMY: SHX149

## 2024-02-15 HISTORY — PX: COLONOSCOPY: SHX5424

## 2024-02-15 SURGERY — COLONOSCOPY
Anesthesia: General

## 2024-02-15 MED ORDER — MIDAZOLAM HCL 2 MG/2ML IJ SOLN
INTRAMUSCULAR | Status: AC
Start: 2024-02-15 — End: ?
  Filled 2024-02-15: qty 2

## 2024-02-15 MED ORDER — LIDOCAINE HCL (CARDIAC) PF 100 MG/5ML IV SOSY
PREFILLED_SYRINGE | INTRAVENOUS | Status: DC | PRN
  Administered 2024-02-15: 100 mg via INTRAVENOUS

## 2024-02-15 MED ORDER — MIDAZOLAM HCL 2 MG/2ML IJ SOLN
INTRAMUSCULAR | Status: DC | PRN
  Administered 2024-02-15: 2 mg via INTRAVENOUS

## 2024-02-15 MED ORDER — GLYCOPYRROLATE 0.2 MG/ML IJ SOLN
INTRAMUSCULAR | Status: DC | PRN
  Administered 2024-02-15 (×2): .2 mg via INTRAVENOUS

## 2024-02-15 MED ORDER — DEXMEDETOMIDINE HCL IN NACL 200 MCG/50ML IV SOLN
INTRAVENOUS | Status: DC | PRN
  Administered 2024-02-15: 12 ug via INTRAVENOUS

## 2024-02-15 MED ORDER — PROPOFOL 500 MG/50ML IV EMUL
INTRAVENOUS | Status: DC | PRN
  Administered 2024-02-15: 165 ug/kg/min via INTRAVENOUS

## 2024-02-15 MED ORDER — PROPOFOL 10 MG/ML IV BOLUS
INTRAVENOUS | Status: DC | PRN
  Administered 2024-02-15: 40 mg via INTRAVENOUS
  Administered 2024-02-15: 60 mg via INTRAVENOUS

## 2024-02-15 MED ORDER — SODIUM CHLORIDE 0.9 % IV SOLN
INTRAVENOUS | Status: DC
  Administered 2024-02-15: 20 mL/h via INTRAVENOUS

## 2024-02-15 NOTE — Anesthesia Procedure Notes (Addendum)
 Procedure Name: General with mask airway Date/Time: 02/15/2024 8:54 AM  Performed by: Niki Barter, CRNAPre-anesthesia Checklist: Patient identified, Emergency Drugs available, Suction available and Patient being monitored Patient Re-evaluated:Patient Re-evaluated prior to induction Oxygen Delivery Method: Simple face mask Induction Type: IV induction Ventilation: Oral airway inserted - appropriate to patient size Dental Injury: Teeth and Oropharynx as per pre-operative assessment

## 2024-02-15 NOTE — Transfer of Care (Addendum)
 Immediate Anesthesia Transfer of Care Note  Patient: Sergio Kennedy  Procedure(s) Performed: COLONOSCOPY POLYPECTOMY, INTESTINE  Patient Location: Endoscopy Unit  Anesthesia Type:General  Level of Consciousness: drowsy and patient cooperative  Airway & Oxygen Therapy: Patient Spontanous Breathing and Patient connected to face mask oxygen  Post-op Assessment: Report given to RN and Post -op Vital signs reviewed and stable  Post vital signs: Reviewed and stable  Last Vitals:  Vitals Value Taken Time  BP 89/64 02/15/24 0939  Temp 36.1 C 02/15/24 0939  Pulse 78 02/15/24 0946  Resp 14 02/15/24 0946  SpO2 99 % 02/15/24 0946  Vitals shown include unfiled device data.  Last Pain:  Vitals:   02/15/24 0939  TempSrc: Temporal  PainSc: Asleep         Complications: No notable events documented.

## 2024-02-15 NOTE — Op Note (Signed)
 [facility] Gastroenterology Patient Name: Sergio Kennedy Procedure Date: 02/15/2024 8:42 AM MRN: [id number] Account #: [id number] Date of Birth: [birth year] Admit Type: Outpatient Age: 68 Room: [facility] ENDO ROOM 1 Gender: Male Note Status: Finalized Instrument Name: Hyman Main [id number] Procedure:             Colonoscopy Indications:           Screening for colorectal malignant neoplasm, Screening                         in patient at increased risk: Family history of                         1st-degree relative with colorectal cancer Providers:             Bridgett Camps, DO Referring MD:          Emi Hanson (Referring MD) Medicines:             Monitored Anesthesia Care Complications:         No immediate complications. Estimated blood loss:                         Minimal. Procedure:             Pre-Anesthesia Assessment:                        - Prior to the procedure, a History and Physical was                         performed, and patient medications and allergies were                         reviewed. The patient is competent. The risks and                         benefits of the procedure and the sedation options and                         risks were discussed with the patient. All questions                         were answered and informed consent was obtained.                         Patient identification and proposed procedure were                         verified by the physician, the nurse, the anesthetist                         and the technician in the endoscopy suite. Mental                         Status Examination: alert and oriented. Airway                         Examination: normal oropharyngeal airway and neck  mobility. Respiratory Examination: clear to                         auscultation. CV Examination: RRR, no murmurs, no S3                         or S4. Prophylactic Antibiotics: The patient does not                          require prophylactic antibiotics. Prior                         Anticoagulants: The patient has taken no anticoagulant                         or antiplatelet agents. ASA Grade Assessment: II - A                         patient with mild systemic disease. After reviewing                         the risks and benefits, the patient was deemed in                         satisfactory condition to undergo the procedure. The                         anesthesia plan was to use monitored anesthesia care                         (MAC). Immediately prior to administration of                         medications, the patient was re-assessed for adequacy                         to receive sedatives. The heart rate, respiratory                         rate, oxygen saturations, blood pressure, adequacy of                         pulmonary ventilation, and response to care were                         monitored throughout the procedure. The physical                         status of the patient was re-assessed after the                         procedure.                        After obtaining informed consent, the colonoscope was                         passed under direct vision. Throughout the procedure,  the patient's blood pressure, pulse, and oxygen                         saturations were monitored continuously. The                         Colonoscope was introduced through the anus and                         advanced to the the cecum, identified by appendiceal                         orifice and ileocecal valve. The colonoscopy was                         performed without difficulty. The patient tolerated                         the procedure well. The quality of the bowel                         preparation was evaluated using the BBPS [facility] Bowel                         Preparation Scale) with scores of: Right Colon = 3,                          Transverse Colon = 3 and Left Colon = 3 (entire mucosa                         seen well with no residual staining, small fragments                         of stool or opaque liquid). The total BBPS score                         equals 9. The terminal ileum, ileocecal valve,                         appendiceal orifice, and rectum were photographed. Findings:      The perianal and digital rectal examinations were normal. Pertinent       negatives include normal sphincter tone.      Five sessile polyps were found in the rectum, transverse colon and       ascending colon. The polyps were 1 to 2 mm in size. These polyps were       removed with a jumbo cold forceps. Resection and retrieval were       complete. Estimated blood loss was minimal.      Two sessile polyps were found in the transverse colon and cecum. The       polyps were 3 to 5 mm in size. These polyps were removed with a cold       snare. Resection and retrieval were complete. Estimated blood loss was       minimal.      A 5 to 6 mm polyp was found in the sigmoid colon. The polyp was       pedunculated. The polyp was  removed with a hot snare. Resection and       retrieval were complete. Estimated blood loss was minimal.      Multiple small-mouthed diverticula were found in the left colon.       Estimated blood loss: none.      Non-bleeding internal hemorrhoids were found during retroflexion. The       hemorrhoids were Grade I (internal hemorrhoids that do not prolapse).       Estimated blood loss: none.      The exam was otherwise without abnormality on direct and retroflexion       views. Impression:            - Five 1 to 2 mm polyps in the rectum, in the                         transverse colon and in the ascending colon, removed                         with a jumbo cold forceps. Resected and retrieved.                        - Two 3 to 5 mm polyps in the transverse colon and in                         the cecum, removed  with a cold snare. Resected and                         retrieved.                        - One 5 to 6 mm polyp in the sigmoid colon, removed                         with a hot snare. Resected and retrieved.                        - Diverticulosis in the left colon.                        - Non-bleeding internal hemorrhoids.                        - The examination was otherwise normal on direct and                         retroflexion views. Recommendation:        - Patient has a contact number available for                         emergencies. The signs and symptoms of potential                         delayed complications were discussed with the patient.                         Return to normal activities tomorrow. Written  discharge instructions were provided to the patient.                        - Discharge patient to home.                        - Resume previous diet.                        - Continue present medications.                        - No ibuprofen, naproxen, or other non-steroidal                         anti-inflammatory drugs for 5 days after polyp removal.                        - Await pathology results.                        - Repeat colonoscopy for surveillance based on                         pathology results.                        - Return to referring physician as previously                         scheduled.                        - The findings and recommendations were discussed with                         the patient. Procedure Code(s):     --- Professional ---                        [phone], Colonoscopy, flexible; with removal of                         tumor(s), polyp(s), or other lesion(s) by snare                         technique                        [id number], 59, Colonoscopy, flexible; with biopsy, single                         or multiple Diagnosis Code(s):     --- Professional ---                        Z12.11,  Encounter for screening for malignant neoplasm                         of colon                        Z80.0, Family history of malignant neoplasm of  digestive organs                        D12.8, Benign neoplasm of rectum                        D12.2, Benign neoplasm of ascending colon                        D12.3, Benign neoplasm of transverse colon (hepatic                         flexure or splenic flexure)                        D12.0, Benign neoplasm of cecum                        D12.5, Benign neoplasm of sigmoid colon                        K64.0, First degree hemorrhoids                        K57.30, Diverticulosis of large intestine without                         perforation or abscess without bleeding CPT copyright 2022 American Medical Association. All rights reserved. The codes documented in this report are preliminary and upon coder review may  be revised to meet current compliance requirements. Attending Participation:      I personally performed the entire procedure. Polo Brisk, DO Quintin Buckle DO, DO 02/15/2024 9:41:22 AM This report has been signed electronically. Number of Addenda: 0 Note Initiated On: 02/15/2024 8:42 AM Scope Withdrawal Time: 0 hours 12 minutes 39 seconds  Total Procedure Duration: 0 hours 35 minutes 0 seconds  Estimated Blood Loss:  Estimated blood loss was minimal.      [facility]

## 2024-02-15 NOTE — Anesthesia Preprocedure Evaluation (Signed)
 Anesthesia Evaluation  Patient identified by MRN, date of birth, ID band Patient awake    Reviewed: Allergy & Precautions, NPO status , Patient's Chart, lab work & pertinent test results  History of Anesthesia Complications Negative for: history of anesthetic complications  Airway Mallampati: III  TM Distance: >3 FB Neck ROM: full    Dental no notable dental hx.    Pulmonary sleep apnea    Pulmonary exam normal        Cardiovascular hypertension, On Medications negative cardio ROS Normal cardiovascular exam     Neuro/Psych negative neurological ROS  negative psych ROS   GI/Hepatic negative GI ROS, Neg liver ROS,,,  Endo/Other  Hypothyroidism    Renal/GU negative Renal ROS  negative genitourinary   Musculoskeletal   Abdominal   Peds  Hematology negative hematology ROS (+)   Anesthesia Other Findings Past Medical History: No date: Hypertension No date: Irregular heart rhythm No date: Sleep apnea No date: Thyroid disease  Past Surgical History: No date: BACK SURGERY No date: FOOT SURGERY  BMI    Body Mass Index: 31.35 kg/m      Reproductive/Obstetrics negative OB ROS                             Anesthesia Physical Anesthesia Plan  ASA: 2  Anesthesia Plan: General   Post-op Pain Management: Minimal or no pain anticipated   Induction: Intravenous  PONV Risk Score and Plan: 1 and Propofol infusion and TIVA  Airway Management Planned: Natural Airway and Nasal Cannula  Additional Equipment:   Intra-op Plan:   Post-operative Plan:   Informed Consent: I have reviewed the patients History and Physical, chart, labs and discussed the procedure including the risks, benefits and alternatives for the proposed anesthesia with the patient or authorized representative who has indicated his/her understanding and acceptance.     Dental Advisory Given  Plan Discussed with:  Anesthesiologist, CRNA and Surgeon  Anesthesia Plan Comments: (Patient consented for risks of anesthesia including but not limited to:  - adverse reactions to medications - risk of airway placement if required - damage to eyes, teeth, lips or other oral mucosa - nerve damage due to positioning  - sore throat or hoarseness - Damage to heart, brain, nerves, lungs, other parts of body or loss of life  Patient voiced understanding and assent.)       Anesthesia Quick Evaluation

## 2024-02-15 NOTE — H&P (Signed)
 Pre-Procedure H&P   Patient ID: Sergio Kennedy is a 68 y.o. male.  Gastroenterology Provider: Quintin Buckle, Sergio Kennedy  Referring Provider: Dr. Harwood Lingo PCP: Eartha Gold, MD  Date: 02/15/2024  HPI Sergio Kennedy is a 68 y.o. male who presents today for Colonoscopy for Colorectal cancer screening .  Patient initial screening colonoscopy.  Father with a history of colon cancer and polyps.  Patient reportedly had a Cologuard in the past which was negative  Twice daily bowel movements without melena or hematochezia  Creatinine 1.0 hemoglobin 15.4 MCV 92.4 platelets 238,000   Past Medical History:  Diagnosis Date   Hypertension    Irregular heart rhythm    Sleep apnea    Thyroid disease     Past Surgical History:  Procedure Laterality Date   BACK SURGERY     FOOT SURGERY      Family History Father- crc and colon polyps No other h/o GI disease or malignancy  Review of Systems  Constitutional:  Negative for activity change, appetite change, chills, diaphoresis, fatigue, fever and unexpected weight change.  HENT:  Negative for trouble swallowing and voice change.   Respiratory:  Negative for shortness of breath and wheezing.   Cardiovascular:  Negative for chest pain, palpitations and leg swelling.  Gastrointestinal:  Negative for abdominal distention, abdominal pain, anal bleeding, blood in stool, constipation, diarrhea, nausea and vomiting.  Musculoskeletal:  Negative for arthralgias and myalgias.  Skin:  Negative for color change and pallor.  Neurological:  Negative for dizziness, syncope and weakness.  Psychiatric/Behavioral:  Negative for confusion. The patient is not nervous/anxious.   All other systems reviewed and are negative.    Medications No current facility-administered medications on file prior to encounter.   Current Outpatient Medications on File Prior to Encounter  Medication Sig Dispense Refill   aspirin EC 81 MG tablet Take 81 mg  by mouth daily.     levothyroxine (SYNTHROID) 75 MCG tablet Take 75 mcg by mouth daily before breakfast.     losartan-hydrochlorothiazide (HYZAAR) 50-12.5 MG tablet Take 1 tablet by mouth 2 (two) times daily.  0    Pertinent medications related to GI and procedure were reviewed by me with the patient prior to the procedure   Current Facility-Administered Medications:    0.9 %  sodium chloride  infusion, , Intravenous, Continuous, Sergio Buckle, Sergio Kennedy, Last Rate: 20 mL/hr at 02/15/24 0820, 20 mL/hr at 02/15/24 0820  sodium chloride  20 mL/hr (02/15/24 0820)       No Known Allergies Allergies were reviewed by me prior to the procedure  Objective   Body mass index is 31.35 kg/m. Vitals:   02/15/24 0810  BP: (!) 124/91  Pulse: 67  Resp: 20  Temp: (!) 96.5 F (35.8 C)  TempSrc: Temporal  SpO2: 100%  Weight: 93.5 kg  Height: 5\' 8"  (1.727 m)     Physical Exam Vitals and nursing note reviewed.  Constitutional:      General: He is not in acute distress.    Appearance: Normal appearance. He is not ill-appearing, toxic-appearing or diaphoretic.  HENT:     Head: Normocephalic and atraumatic.     Nose: Nose normal.     Mouth/Throat:     Mouth: Mucous membranes are moist.     Pharynx: Oropharynx is clear.  Eyes:     General: No scleral icterus.    Extraocular Movements: Extraocular movements intact.  Cardiovascular:     Rate and Rhythm: Normal rate and  regular rhythm.     Heart sounds: Normal heart sounds. No murmur heard.    No friction rub. No gallop.  Pulmonary:     Effort: Pulmonary effort is normal. No respiratory distress.     Breath sounds: Normal breath sounds. No wheezing, rhonchi or rales.  Abdominal:     General: Bowel sounds are normal. There is no distension.     Palpations: Abdomen is soft.     Tenderness: There is no abdominal tenderness. There is no guarding or rebound.  Musculoskeletal:     Cervical back: Neck supple.     Right lower leg: No edema.      Left lower leg: No edema.  Skin:    General: Skin is warm and dry.     Coloration: Skin is not jaundiced or pale.  Neurological:     General: No focal deficit present.     Mental Status: He is alert and oriented to person, place, and time. Mental status is at baseline.  Psychiatric:        Mood and Affect: Mood normal.        Behavior: Behavior normal.        Thought Content: Thought content normal.        Judgment: Judgment normal.      Assessment:  Sergio Kennedy is a 68 y.o. male  who presents today for Colonoscopy for Colorectal cancer screening .  Plan:  Colonoscopy with possible intervention today  Colonoscopy with possible biopsy, control of bleeding, polypectomy, and interventions as necessary has been discussed with the patient/patient representative. Informed consent was obtained from the patient/patient representative after explaining the indication, nature, and risks of the procedure including but not limited to death, bleeding, perforation, missed neoplasm/lesions, cardiorespiratory compromise, and reaction to medications. Opportunity for questions was given and appropriate answers were provided. Patient/patient representative has verbalized understanding is amenable to undergoing the procedure.   Sergio Buckle, Sergio Kennedy  [facility] Gastroenterology  Portions of the record may have been created with voice recognition software. Occasional wrong-word or 'sound-a-like' substitutions may have occurred due to the inherent limitations of voice recognition software.  Read the chart carefully and recognize, using context, where substitutions may have occurred.

## 2024-02-15 NOTE — Interval H&P Note (Signed)
 History and Physical Interval Note: Preprocedure H&P from 02/15/24  was reviewed and there was no interval change after seeing and examining the patient.  Written consent was obtained from the patient after discussion of risks, benefits, and alternatives. Patient has consented to proceed with Colonoscopy with possible intervention   02/15/2024 8:47 AM  Sergio Kennedy  has presented today for surgery, with the diagnosis of Colon cancer screening (Z12.11) Family history of colon cancer (Z80.0) Family hx colonic polyps (Z83.719).  The various methods of treatment have been discussed with the patient and family. After consideration of risks, benefits and other options for treatment, the patient has consented to  Procedure(s): COLONOSCOPY (N/A) as a surgical intervention.  The patient's history has been reviewed, patient examined, no change in status, stable for surgery.  I have reviewed the patient's chart and labs.  Questions were answered to the patient's satisfaction.     Sergio Kennedy

## 2024-02-15 NOTE — OR Nursing (Signed)
 Daughter called, she was just pulling into parking lot when pt in recovery. Will call dtr back to pull car around when pt wheeled down.  Dtr not brought back to recovery.

## 2024-02-15 NOTE — Anesthesia Postprocedure Evaluation (Signed)
 Anesthesia Post Note  Patient: Sergio Kennedy  Procedure(s) Performed: COLONOSCOPY POLYPECTOMY, INTESTINE  Patient location during evaluation: Endoscopy Anesthesia Type: General Level of consciousness: awake and alert Pain management: pain level controlled Vital Signs Assessment: post-procedure vital signs reviewed and stable Respiratory status: spontaneous breathing, nonlabored ventilation, respiratory function stable and patient connected to nasal cannula oxygen Cardiovascular status: blood pressure returned to baseline and stable Postop Assessment: no apparent nausea or vomiting Anesthetic complications: no   No notable events documented.   Last Vitals:  Vitals:   02/15/24 0959 02/15/24 1011  BP: (!) 124/93 (!) 122/91  Pulse: 89 75  Resp: 16 19  Temp:    SpO2: 95% 94%    Last Pain:  Vitals:   02/15/24 1011  TempSrc:   PainSc: 0-No pain                 Vanice Genre

## 2024-02-18 LAB — SURGICAL PATHOLOGY

## 2024-03-10 ENCOUNTER — Telehealth: Payer: Self-pay | Admitting: Physician Assistant

## 2024-03-10 ENCOUNTER — Telehealth: Payer: Self-pay | Admitting: Internal Medicine

## 2024-03-10 NOTE — Telephone Encounter (Signed)
 error

## 2024-03-10 NOTE — Telephone Encounter (Signed)
 Cpap supply order signed. Faxed back to [facility]; [phone]. Scanned-Toni

## 2024-03-10 NOTE — Telephone Encounter (Signed)
 Received cpap supply order from [facility]. Gave to DSK for signature-Toni

## 2024-04-24 ENCOUNTER — Ambulatory Visit: Payer: Self-pay | Admitting: General Surgery

## 2024-04-24 NOTE — H&P (View-Only) (Signed)
 History of Present Illness Sergio Kennedy is a 68 year old male who presents with an enlarging right neck soft tissue mass.  He was initially evaluated for a right neck epidermoid cyst on June 29, 2023, when the mass was asymptomatic. Recently, he noticed an increase in the size of the mass, particularly after carrying beams on his shoulders. Despite the enlargement, there is no associated pain or discomfort.  He recalls that the mass remained stable until a few days after the physical activity involving carrying beams. He describes the mass as 'pretty big' now, indicating a significant change in size since the last evaluation. No other symptoms are associated with the mass.  His past medical history includes a previous excision for melanoma, which healed well, allowing him to return to work after a week. 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 He works three days a week, which he believes will facilitate his recovery process.      PAST MEDICAL HISTORY:  Past Medical History:  Diagnosis Date  . Borderline diabetic   . Chronic atrial fibrillation (CMS/HHS-HCC) 05/23/2018  . Hypertension   . Malignant melanoma (CMS/HHS-HCC) 04/17/2023  . Melanoma (CMS/HHS-HCC)    2024  . Obstructive sleep apnea 05/23/2018  . Thyroid disease         PAST SURGICAL HISTORY:   Past Surgical History:  Procedure Laterality Date  . Colon @ [facility]  02/15/2024   Tubular adenomas/Hyperplastic polyps/Repeat 35yrs/SMR  . Back Surgery     2000  . Foot Surgery     2006         MEDICATIONS:  Outpatient Encounter Medications as of 04/24/2024  Medication Sig Dispense Refill  . aspirin 81 MG EC tablet Take 81 mg by mouth once daily    . cholecalciferol 1000 unit tablet Take 1,000 Units by mouth once daily    . ketoconazole (NIZORAL) 2 % shampoo as directed for Itching    . levothyroxine (SYNTHROID) 88 MCG tablet Take 1 tablet (88  mcg total) by mouth once daily Take on an empty stomach with a glass of water at least 30-60 minutes before breakfast. 90 tablet 3  . losartan-hydroCHLOROthiazide (HYZAAR) 100-25 mg tablet Take 1 tablet by mouth once daily 90 tablet 3  . MAGNESIUM ORAL Take 500 mg by mouth once daily    . multivitamin tablet Take 1 tablet by mouth once daily    . simvastatin (ZOCOR) 10 MG tablet Take 1 tablet by mouth at bedtime    . sodium, potassium, and magnesium (SUPREP) oral solution Take 2 Bottles (1 kit total) by mouth as directed One kit contains 2 bottles.  Take both bottles at the times instructed by your provider. 354 mL 0  . [DISCONTINUED] losartan-hydroCHLOROthiazide (HYZAAR) 50-12.5 mg tablet Take 1 tablet by mouth 2 (two) times daily 180 tablet 3   No facility-administered encounter medications on file as of 04/24/2024.     ALLERGIES:   Patient has no known allergies.   SOCIAL HISTORY:  Social History   Socioeconomic History  . Marital status: Divorced  . Number of children: 1  Occupational History  . Occupation: [employer]    Comment: Part time - 24 hours per week  Tobacco Use  . Smoking status: Never  . Smokeless tobacco: Never  Substance and Sexual Activity  . Alcohol use: Not Currently   Social Drivers of Health   Financial Resource Strain: Low Risk  (  04/18/2024)   Overall Financial Resource Strain (CARDIA)   . Difficulty of Paying Living Expenses: Not hard at all  Food Insecurity: No Food Insecurity (04/18/2024)   Hunger Vital Sign   . Worried About Programme researcher, broadcasting/film/video in the Last Year: Never true   . Ran Out of Food in the Last Year: Never true  Transportation Needs: No Transportation Needs (04/18/2024)   PRAPARE - Transportation   . Lack of Transportation (Medical): No   . Lack of Transportation (Non-Medical): No    FAMILY HISTORY:  Family History  Problem Relation Name Age of Onset  . Diabetes Mother    . Skin cancer Father Lucifer Soja   . Colon cancer  Father Alicia Ackert   . Colon polyps Father Bakari Nikolai   . Asthma Sister    . High blood pressure (Hypertension) Brother Garrel rattler   . Skin cancer Brother darrnell mangiaracina   . Osteoarthritis Paternal Grandfather       GENERAL REVIEW OF SYSTEMS:   General ROS: negative for - chills, fatigue, fever, weight gain or weight loss Allergy and Immunology ROS: negative for - hives  Hematological and Lymphatic ROS: negative for - bleeding problems or bruising, negative for palpable nodes Endocrine ROS: negative for - heat or cold intolerance, hair changes Respiratory ROS: negative for - cough, shortness of breath or wheezing Cardiovascular ROS: no chest pain or palpitations GI ROS: negative for nausea, vomiting, abdominal pain, diarrhea, constipation Musculoskeletal ROS: negative for - joint swelling or muscle pain Neurological ROS: negative for - confusion, syncope Dermatological ROS: negative for pruritus and rash  PHYSICAL EXAM:  Vitals:   04/24/24 0834  BP: (!) 142/91  Pulse: 77  .  Ht:165.1 cm (5' 5) Wt:93.9 kg (207 lb) ADJ:Anib surface area is 2.08 meters squared. Body mass index is 34.45 kg/m.SABRA   GENERAL: Alert, active, oriented x3  HEENT: Pupils equal reactive to light. Extraocular movements are intact. Sclera clear. Palpebral conjunctiva normal red color.Pharynx clear.  NECK: Supple with large, deep mass, externally 6 cm. No skin changes. Oval shape.   LUNGS: Sound clear with no rales rhonchi or wheezes.  HEART: Regular rhythm S1 and S2 without murmur.  ABDOMEN: Soft and depressible, nontender with no palpable mass, no hepatomegaly.   EXTREMITIES: Well-developed well-nourished symmetrical with no dependent edema.  NEUROLOGICAL: Awake alert oriented, facial expression symmetrical, moving all extremities.  Assessment & Plan Large soft tissue mass of right neck   The mass on his right neck has increased in size since the last evaluation in October 2024. It is  significantly larger but remains asymptomatic. Due to its size and proximity to blood vessels, surgical excision in the operating room is recommended for better control and comfort. Schedule surgical excision with sedation for comfort. Perform incision, remove the cyst with its capsule and root, and close the incision in layers with external skin glue. Advise against using the affected side for about a week post-operatively. Recommend light duties at work, relying on the unaffected side. Advise against driving on the day of the procedure due to sedation; driving may resume the following day if comfortable.   Mass of right side of neck [R22.1]          Patient verbalized understanding, all questions were answered, and were agreeable with the plan outlined above.   Lucas Sjogren, MD  Electronically signed by Lucas Sjogren, MD

## 2024-04-29 ENCOUNTER — Other Ambulatory Visit: Payer: Self-pay

## 2024-04-29 ENCOUNTER — Encounter
Admission: RE | Admit: 2024-04-29 | Discharge: 2024-04-29 | Disposition: A | Discharge status: Home / Self Care | Source: Ambulatory Visit | Attending: General Surgery | Admitting: General Surgery

## 2024-04-29 HISTORY — DX: Personal history of urinary calculi: Z87.442

## 2024-04-29 HISTORY — DX: Obesity, class 1: E66.811

## 2024-04-29 HISTORY — DX: Hypothyroidism, unspecified: E03.9

## 2024-04-29 HISTORY — DX: Hyperlipidemia, unspecified: E78.5

## 2024-04-29 HISTORY — DX: Prediabetes: R73.03

## 2024-04-29 HISTORY — DX: Essential (primary) hypertension: I10

## 2024-04-29 HISTORY — DX: Malignant melanoma of skin, unspecified: C43.9

## 2024-04-29 HISTORY — DX: Type 2 diabetes mellitus without complications: E11.9

## 2024-04-29 HISTORY — DX: Malignant (primary) neoplasm, unspecified: C80.1

## 2024-04-29 NOTE — Patient Instructions (Addendum)
 Your procedure is scheduled on: Wednesday 04/30/24 Report to the Registration Desk on the 1st floor of the [facility]. To find out your arrival time, please call [phone] between 1PM - 3PM on: Tuesday 04/29/24 If your arrival time is 6:00 am, do not arrive before that time as the [facility] entrance doors do not open until 6:00 am.  REMEMBER: Instructions that are not followed completely may result in serious medical risk, up to and including death; or upon the discretion of your surgeon and anesthesiologist your surgery may need to be rescheduled.  Do not eat food or drink fluids after midnight the night before surgery.  No gum chewing or hard candies.  Stop aspirin EC 81 M 7 days prior to surgery as instructed by your doctor.  One week prior to surgery: Stop Anti-inflammatories (NSAIDS) such as Advil, Aleve, Ibuprofen, Motrin, Naproxen, Naprosyn and Aspirin based products such as Excedrin, Goody's Powder, BC Powder. Stop ANY OVER THE COUNTER supplements until after surgery. cholecalciferol (VITAMIN D3)  magnesium gluconate (MAGONATE) Multiple Vitamins-Minerals (MULTIVITAMIN WITH MINERALS)    You may however, continue to take Tylenol  if needed for pain up until the day of surgery.   Continue taking all of your other prescription medications up until the day of surgery.  ON THE DAY OF SURGERY ONLY TAKE THESE MEDICATIONS WITH SIPS OF WATER:  levothyroxine (SYNTHROID) 88 MCG   No Alcohol for 24 hours before or after surgery.  No Smoking including e-cigarettes for 24 hours before surgery.  No chewable tobacco products for at least 6 hours before surgery.  No nicotine patches on the day of surgery.  Do not use any recreational drugs for at least a week (preferably 2 weeks) before your surgery.  Please be advised that the combination of cocaine and anesthesia may have negative outcomes, up to and including death. If you test positive for cocaine, your surgery will be  cancelled.  On the morning of surgery brush your teeth with toothpaste and water, you may rinse your mouth with mouthwash if you wish. Do not swallow any toothpaste or mouthwash.  Use CHG Soap or wipes as directed on instruction sheet.  Do not wear jewelry, make-up, hairpins, clips or nail polish.  For welded (permanent) jewelry: bracelets, anklets, waist bands, etc.  Please have this removed prior to surgery.  If it is not removed, there is a chance that hospital personnel will need to cut it off on the day of surgery.  Do not wear lotions, powders, or perfumes.   Do not shave body hair from the neck down 48 hours before surgery.  Contact lenses, hearing aids and dentures may not be worn into surgery.  Do not bring valuables to the hospital. [facility] is not responsible for any missing/lost belongings or valuables.   Notify your doctor if there is any change in your medical condition (cold, fever, infection).  Wear comfortable clothing (specific to your surgery type) to the hospital.  If you are being admitted to the hospital overnight, leave your suitcase in the car. After surgery it may be brought to your room.  In case of increased patient census, it may be necessary for you, the patient, to continue your postoperative care in the Same Day Surgery department.  If you are being discharged the day of surgery, you will not be allowed to drive home. You will need a responsible individual to drive you home and stay with you for 24 hours after surgery.   If you  are taking public transportation, you will need to have a responsible individual with you.  Please call the Pre-admissions Testing Dept. at [phone] if you have any questions about these instructions.  Surgery Visitation Policy:  Patients having surgery or a procedure may have two visitors.  Children under the age of 50 must have an adult with them who is not the patient.  Inpatient Visitation:    Visiting hours  are 7 a.m. to 8 p.m. Up to four visitors are allowed at one time in a patient room. The visitors may rotate out with other people during the day.  One visitor age 76 or older may stay with the patient overnight and must be in the room by 8 p.m.   Merchandiser, retail to address health-related social needs:  [email or web address]     Preparing for Surgery with CHLORHEXIDINE  GLUCONATE (CHG) Soap  Chlorhexidine  Gluconate (CHG) Soap  o An antiseptic cleaner that kills germs and bonds with the skin to continue killing germs even after washing  o Used for showering the night before surgery and morning of surgery  Before surgery, you can play an important role by reducing the number of germs on your skin.  CHG (Chlorhexidine  gluconate) soap is an antiseptic cleanser which kills germs and bonds with the skin to continue killing germs even after washing.  Please do not use if you have an allergy to CHG or antibacterial soaps. If your skin becomes reddened/irritated stop using the CHG.  1. Shower the NIGHT BEFORE SURGERY and the MORNING OF SURGERY with CHG soap.  2. If you choose to wash your hair, wash your hair first as usual with your normal shampoo.  3. After shampooing, rinse your hair and body thoroughly to remove the shampoo.  4. Use CHG as you would any other liquid soap. You can apply CHG directly to the skin and wash gently with a scrungie or a clean washcloth.  5. Apply the CHG soap to your body only from the neck down. Do not use on open wounds or open sores. Avoid contact with your eyes, ears, mouth, and genitals (private parts). Wash face and genitals (private parts) with your normal soap.  6. Wash thoroughly, paying special attention to the area where your surgery will be performed.  7. Thoroughly rinse your body with warm water.  8. Do not shower/wash with your normal soap after using and rinsing off the CHG soap.  9. Pat yourself dry with a clean  towel.  10. Wear clean pajamas to bed the night before surgery.  12. Place clean sheets on your bed the night of your first shower and do not sleep with pets.  13. Shower again with the CHG soap on the day of surgery prior to arriving at the hospital.  14. Do not apply any deodorants/lotions/powders.  15. Please wear clean clothes to the hospital.

## 2024-04-30 ENCOUNTER — Ambulatory Visit
Admission: RE | Admit: 2024-04-30 | Discharge: 2024-04-30 | Disposition: A | Discharge status: Home / Self Care | Attending: General Surgery | Admitting: General Surgery

## 2024-04-30 ENCOUNTER — Other Ambulatory Visit: Payer: Self-pay

## 2024-04-30 ENCOUNTER — Encounter
Admission: RE | Disposition: A | Discharge status: Home / Self Care | Payer: Self-pay | Source: Home / Self Care | Attending: General Surgery

## 2024-04-30 ENCOUNTER — Ambulatory Visit: Payer: Self-pay | Admitting: Urgent Care

## 2024-04-30 ENCOUNTER — Encounter: Payer: Self-pay | Admitting: General Surgery

## 2024-04-30 ENCOUNTER — Ambulatory Visit: Payer: Self-pay

## 2024-04-30 DIAGNOSIS — Z79890 Hormone replacement therapy (postmenopausal): Secondary | ICD-10-CM | POA: Diagnosis not present

## 2024-04-30 DIAGNOSIS — Z79899 Other long term (current) drug therapy: Secondary | ICD-10-CM | POA: Insufficient documentation

## 2024-04-30 DIAGNOSIS — Z7982 Long term (current) use of aspirin: Secondary | ICD-10-CM | POA: Diagnosis not present

## 2024-04-30 DIAGNOSIS — G4733 Obstructive sleep apnea (adult) (pediatric): Secondary | ICD-10-CM | POA: Diagnosis not present

## 2024-04-30 DIAGNOSIS — Z833 Family history of diabetes mellitus: Secondary | ICD-10-CM | POA: Insufficient documentation

## 2024-04-30 DIAGNOSIS — E039 Hypothyroidism, unspecified: Secondary | ICD-10-CM | POA: Diagnosis not present

## 2024-04-30 DIAGNOSIS — C490 Malignant neoplasm of connective and soft tissue of head, face and neck: Secondary | ICD-10-CM | POA: Insufficient documentation

## 2024-04-30 DIAGNOSIS — R7303 Prediabetes: Secondary | ICD-10-CM | POA: Insufficient documentation

## 2024-04-30 DIAGNOSIS — I10 Essential (primary) hypertension: Secondary | ICD-10-CM | POA: Insufficient documentation

## 2024-04-30 DIAGNOSIS — I482 Chronic atrial fibrillation, unspecified: Secondary | ICD-10-CM | POA: Insufficient documentation

## 2024-04-30 HISTORY — PX: EXCISION MASS NECK: SHX6703

## 2024-04-30 SURGERY — EXCISION, MASS, NECK
Anesthesia: General | Site: Neck | Wound class: Clean

## 2024-04-30 MED ORDER — PROPOFOL 10 MG/ML IV BOLUS
INTRAVENOUS | Status: AC
  Filled 2024-04-30: qty 40

## 2024-04-30 MED ORDER — MIDAZOLAM HCL 2 MG/2ML IJ SOLN
INTRAMUSCULAR | Status: DC | PRN
Start: 2024-04-30 — End: 2024-04-30
  Administered 2024-04-30: 2 mg via INTRAVENOUS

## 2024-04-30 MED ORDER — BUPIVACAINE-EPINEPHRINE 0.5% -1:200000 IJ SOLN
INTRAMUSCULAR | Status: DC | PRN
Start: 2024-04-30 — End: 2024-04-30
  Administered 2024-04-30: 16 mL

## 2024-04-30 MED ORDER — ONDANSETRON HCL 4 MG/2ML IJ SOLN
INTRAMUSCULAR | Status: AC
  Filled 2024-04-30: qty 2

## 2024-04-30 MED ORDER — FENTANYL CITRATE (PF) 100 MCG/2ML IJ SOLN: 25.0000 ug | INTRAMUSCULAR | Status: DC | PRN

## 2024-04-30 MED ORDER — LIDOCAINE HCL (PF) 2 % IJ SOLN
INTRAMUSCULAR | Status: AC
  Filled 2024-04-30: qty 5

## 2024-04-30 MED ORDER — PROPOFOL 1000 MG/100ML IV EMUL
INTRAVENOUS | Status: AC
  Filled 2024-04-30: qty 100

## 2024-04-30 MED ORDER — DEXMEDETOMIDINE HCL IN NACL 80 MCG/20ML IV SOLN
INTRAVENOUS | Status: DC | PRN
  Administered 2024-04-30 (×2): 4 ug via INTRAVENOUS

## 2024-04-30 MED ORDER — HEMOSTATIC AGENTS (NO CHARGE) OPTIME
TOPICAL | Status: DC | PRN
Start: 2024-04-30 — End: 2024-04-30
  Administered 2024-04-30: 1 via TOPICAL

## 2024-04-30 MED ORDER — ONDANSETRON HCL 4 MG/2ML IJ SOLN
INTRAMUSCULAR | Status: DC | PRN
  Administered 2024-04-30: 4 mg via INTRAVENOUS

## 2024-04-30 MED ORDER — GLYCOPYRROLATE 0.2 MG/ML IJ SOLN
INTRAMUSCULAR | Status: DC | PRN
  Administered 2024-04-30: .2 mg via INTRAVENOUS

## 2024-04-30 MED ORDER — CEFAZOLIN SODIUM-DEXTROSE 2-4 GM/100ML-% IV SOLN
INTRAVENOUS | Status: AC
  Filled 2024-04-30: qty 100

## 2024-04-30 MED ORDER — CHLORHEXIDINE GLUCONATE 0.12 % MT SOLN
OROMUCOSAL | Status: AC
  Filled 2024-04-30: qty 15

## 2024-04-30 MED ORDER — ACETAMINOPHEN 10 MG/ML IV SOLN
INTRAVENOUS | Status: AC
  Filled 2024-04-30: qty 100

## 2024-04-30 MED ORDER — FENTANYL CITRATE (PF) 100 MCG/2ML IJ SOLN
INTRAMUSCULAR | Status: DC | PRN
  Administered 2024-04-30 (×2): 25 ug via INTRAVENOUS

## 2024-04-30 MED ORDER — PROPOFOL 500 MG/50ML IV EMUL
INTRAVENOUS | Status: DC | PRN
  Administered 2024-04-30: 125 ug/kg/min via INTRAVENOUS

## 2024-04-30 MED ORDER — ACETAMINOPHEN 10 MG/ML IV SOLN
INTRAVENOUS | Status: DC | PRN
Start: 2024-04-30 — End: 2024-04-30
  Administered 2024-04-30: 1000 mg via INTRAVENOUS

## 2024-04-30 MED ORDER — OXYCODONE HCL 5 MG PO TABS: 5.0000 mg | ORAL_TABLET | Freq: Once | ORAL | Status: DC | PRN

## 2024-04-30 MED ORDER — MIDAZOLAM HCL 2 MG/2ML IJ SOLN
INTRAMUSCULAR | Status: AC
  Filled 2024-04-30: qty 2

## 2024-04-30 MED ORDER — HYDROCODONE-ACETAMINOPHEN 5-325 MG PO TABS: 1.0000 | ORAL_TABLET | Freq: Four times a day (QID) | ORAL | 0 refills | Status: AC | PRN

## 2024-04-30 MED ORDER — ORAL CARE MOUTH RINSE: 15.0000 mL | Freq: Once | OROMUCOSAL | Status: AC

## 2024-04-30 MED ORDER — CEFAZOLIN SODIUM-DEXTROSE 2-4 GM/100ML-% IV SOLN
2.0000 g | INTRAVENOUS | Status: AC
  Administered 2024-04-30: 2 g via INTRAVENOUS

## 2024-04-30 MED ORDER — FENTANYL CITRATE (PF) 100 MCG/2ML IJ SOLN
INTRAMUSCULAR | Status: AC
  Filled 2024-04-30: qty 2

## 2024-04-30 MED ORDER — LIDOCAINE HCL (CARDIAC) PF 100 MG/5ML IV SOSY
PREFILLED_SYRINGE | INTRAVENOUS | Status: DC | PRN
Start: 2024-04-30 — End: 2024-04-30
  Administered 2024-04-30: 100 mg via INTRAVENOUS

## 2024-04-30 MED ORDER — OXYCODONE HCL 5 MG/5ML PO SOLN: 5.0000 mg | Freq: Once | ORAL | Status: DC | PRN

## 2024-04-30 MED ORDER — BUPIVACAINE-EPINEPHRINE (PF) 0.5% -1:200000 IJ SOLN
INTRAMUSCULAR | Status: AC
  Filled 2024-04-30: qty 30

## 2024-04-30 MED ORDER — LACTATED RINGERS IV SOLN: INTRAVENOUS | Status: DC

## 2024-04-30 MED ORDER — CHLORHEXIDINE GLUCONATE 0.12 % MT SOLN
15.0000 mL | Freq: Once | OROMUCOSAL | Status: AC
  Administered 2024-04-30: 15 mL via OROMUCOSAL

## 2024-04-30 MED ORDER — SEVOFLURANE IN SOLN
RESPIRATORY_TRACT | Status: AC
  Filled 2024-04-30: qty 250

## 2024-04-30 SURGICAL SUPPLY — 23 items
CHLORAPREP W/TINT 26 (MISCELLANEOUS) IMPLANT
DERMABOND ADVANCED .7 DNX12 (GAUZE/BANDAGES/DRESSINGS) ×1 IMPLANT
DRAPE LAPAROTOMY 100X77 ABD (DRAPES) ×1 IMPLANT
ELECTRODE REM PT RTRN 9FT ADLT (ELECTROSURGICAL) ×1 IMPLANT
GLOVE BIO SURGEON STRL SZ 6.5 (GLOVE) ×1 IMPLANT
GLOVE BIOGEL PI IND STRL 6.5 (GLOVE) ×1 IMPLANT
GLOVE SURG SYN 6.5 PF PI (GLOVE) ×2 IMPLANT
GOWN STRL REUS W/ TWL LRG LVL3 (GOWN DISPOSABLE) ×3 IMPLANT
KIT TURNOVER KIT A (KITS) ×1 IMPLANT
LABEL OR SOLS (LABEL) ×1 IMPLANT
MANIFOLD NEPTUNE II (INSTRUMENTS) ×1 IMPLANT
NDL HYPO 25X1 1.5 SAFETY (NEEDLE) ×1 IMPLANT
NEEDLE HYPO 25X1 1.5 SAFETY (NEEDLE) ×1 IMPLANT
NS IRRIG 500ML POUR BTL (IV SOLUTION) ×1 IMPLANT
PACK BASIN MINOR ARMC (MISCELLANEOUS) ×1 IMPLANT
POWDER SURGICEL 3.0 GRAM (HEMOSTASIS) IMPLANT
SUT ETHILON 3-0 (SUTURE) IMPLANT
SUT SILK 3 0 SH 30 (SUTURE) IMPLANT
SUT VIC AB 3-0 SH 27X BRD (SUTURE) ×1 IMPLANT
SUTURE MNCRL 4-0 27XMF (SUTURE) ×1 IMPLANT
SYR 10ML LL (SYRINGE) ×1 IMPLANT
TRAP FLUID SMOKE EVACUATOR (MISCELLANEOUS) ×1 IMPLANT
WATER STERILE IRR 500ML POUR (IV SOLUTION) ×1 IMPLANT

## 2024-04-30 NOTE — Anesthesia Postprocedure Evaluation (Signed)
 Anesthesia Post Note  Patient: Sergio Kennedy  Procedure(s) Performed: EXCISION, MASS, NECK (Neck)  Patient location during evaluation: PACU Anesthesia Type: General Level of consciousness: awake and alert Pain management: pain level controlled Vital Signs Assessment: post-procedure vital signs reviewed and stable Respiratory status: spontaneous breathing, nonlabored ventilation, respiratory function stable and patient connected to nasal cannula oxygen Cardiovascular status: blood pressure returned to baseline and stable Postop Assessment: no apparent nausea or vomiting Anesthetic complications: no   No notable events documented.   Last Vitals:  Vitals:   04/30/24 0845 04/30/24 0900  BP: 114/76 112/81  Pulse: 88 79  Resp: 16 19  Temp:  36.6 C  SpO2: 94% 94%    Last Pain:  Vitals:   04/30/24 0900  TempSrc:   PainSc: 0-No pain                 Lendia LITTIE Mae

## 2024-04-30 NOTE — Interval H&P Note (Signed)
 History and Physical Interval Note:  04/30/2024 7:07 AM  Sergio Kennedy  has presented today for surgery, with the diagnosis of R22.1 mass rt side of neck.  The various methods of treatment have been discussed with the patient and family. After consideration of risks, benefits and other options for treatment, the patient has consented to  Procedure(s): EXCISION, MASS, NECK (N/A) as a surgical intervention.  The patient's history has been reviewed, patient examined, no change in status, stable for surgery.  I have reviewed the patient's chart and labs.  Questions were answered to the patient's satisfaction.     Lucas Sjogren

## 2024-04-30 NOTE — Discharge Instructions (Signed)
  Diet: Resume home heart healthy regular diet.   Activity: Increase activity as tolerated. Do not drive or drink alcohol if taking narcotic pain medications.  Wound care: May shower with soapy water and pat dry (do not rub incisions), but no baths or submerging incision underwater until follow-up. (no swimming)   Medications: Resume all home medications. For mild to moderate pain: acetaminophen  (Tylenol ) or ibuprofen (if no kidney disease). Combining Tylenol  with alcohol can substantially increase your risk of causing liver disease. Narcotic pain medications, if prescribed, can be used for severe pain, though may cause nausea, constipation, and drowsiness. Do not combine Tylenol  and Norco within a 6 hour period as Norco contains Tylenol . If you do not need the narcotic pain medication, you do not need to fill the prescription.  Call office [phone]) at any time if any questions, worsening pain, fevers/chills, bleeding, drainage from incision site, or other concerns.

## 2024-04-30 NOTE — Op Note (Signed)
 OPERATION REPORT  Pre Operative Diagnosis: Excision, soft tissue tumor of neck, subfascial  Post operative diagnosis: Excision, soft tissue tumor of neck, subfascial   Anesthesia: MAC and Local   Surgeon: Dr. Rodolph   Indication: This 68 y.o. year old male with a soft tissue mass on the right neck that has been growing in size.    Description of procedure: after orienting patient about the procedure steps and benefits and patient agreed to proceed. Time out was done identifying correct patient and location of procedure. After induction of monitored sedation, local anesthesia was infiltrated around the palpable lesion. With a blade #15, an elliptical incision was made using the skin lines on the right side of the neck. Sharp dissection was carried down intramuscularly deep in the neck.  With cautery I dissected the mass from the surrounding tissue. The mass measured 7 cm x 5 cm. Deep dermal stitches were done with vicryl 3-0 and the skin closed with Monocryl 4-0 in subcuticular fashion.  Dermabond applied.  Specimen sent to pathology.    Complications: none   EBL: minimal  Lucas Rodolph, MD, FACS

## 2024-04-30 NOTE — Anesthesia Procedure Notes (Signed)
 Date/Time: 04/30/2024 7:24 AM  Performed by: Jackye Spanner, CRNAOxygen Delivery Method: Simple face mask

## 2024-04-30 NOTE — Transfer of Care (Signed)
 Immediate Anesthesia Transfer of Care Note  Patient: Sergio Kennedy  Procedure(s) Performed: EXCISION, MASS, NECK (Neck)  Patient Location: PACU  Anesthesia Type:MAC  Level of Consciousness: awake, alert , and drowsy  Airway & Oxygen Therapy: Patient Spontanous Breathing and Patient connected to face mask oxygen  Post-op Assessment: Report given to RN and Post -op Vital signs reviewed and stable  Post vital signs: Reviewed and stable  Last Vitals:  Vitals Value Taken Time  BP 106/72 04/30/24 08:33  Temp 36.8 C 04/30/24 08:33  Pulse 88 04/30/24 08:35  Resp 16 04/30/24 08:35  SpO2 98 % 04/30/24 08:35  Vitals shown include unfiled device data.  Last Pain:  Vitals:   04/30/24 0624  TempSrc: Temporal  PainSc: 0-No pain         Complications: No notable events documented.

## 2024-04-30 NOTE — Anesthesia Preprocedure Evaluation (Signed)
 Anesthesia Evaluation  Patient identified by MRN, date of birth, ID band Patient awake    Reviewed: Allergy & Precautions, NPO status , Patient's Chart, lab work & pertinent test results  History of Anesthesia Complications Negative for: history of anesthetic complications  Airway Mallampati: III  TM Distance: >3 FB Neck ROM: full    Dental no notable dental hx.    Pulmonary sleep apnea and Continuous Positive Airway Pressure Ventilation    Pulmonary exam normal        Cardiovascular hypertension, On Medications + dysrhythmias Atrial Fibrillation      Neuro/Psych negative neurological ROS  negative psych ROS   GI/Hepatic negative GI ROS, Neg liver ROS,,,  Endo/Other  diabetesHypothyroidism    Renal/GU negative Renal ROS  negative genitourinary   Musculoskeletal   Abdominal   Peds  Hematology negative hematology ROS (+)   Anesthesia Other Findings Past Medical History: No date: Cancer (HCC) No date: Class 1 obesity due to excess calories with serious  comorbidity and body mass index (BMI) of 34.0 to 34.9 in adult No date: Diabetes mellitus without complication (HCC) No date: History of kidney stones No date: HTN (hypertension), benign No date: Hyperlipidemia No date: Hypertension No date: Hypothyroidism No date: Hypothyroidism, unspecified No date: Irregular heart rhythm No date: Malignant melanoma, unspecified site (HCC) No date: Pre-diabetes No date: Sleep apnea No date: Thyroid disease  Past Surgical History: 01/2004: BACK SURGERY 02/15/2024: COLONOSCOPY; N/A     Comment:  Procedure: COLONOSCOPY;  Surgeon: Onita Elspeth Sharper,              DO;  Location: [facility] ENDOSCOPY;  Service:               Gastroenterology;  Laterality: N/A; No date: FOOT SURGERY 2023: melanoma and lymph nodes removed     Comment:  on back side of the neck 02/15/2024: POLYPECTOMY     Comment:  Procedure: POLYPECTOMY,  INTESTINE;  Surgeon: Onita Elspeth Sharper, DO;  Location: [facility] ENDOSCOPY;  Service:               Gastroenterology;;     Reproductive/Obstetrics negative OB ROS                              Anesthesia Physical Anesthesia Plan  ASA: 3  Anesthesia Plan: General   Post-op Pain Management: Toradol  IV (intra-op)* and Ofirmev  IV (intra-op)*   Induction: Intravenous  PONV Risk Score and Plan: 2 and Propofol  infusion and TIVA  Airway Management Planned: Natural Airway and Nasal Cannula  Additional Equipment:   Intra-op Plan:   Post-operative Plan:   Informed Consent: I have reviewed the patients History and Physical, chart, labs and discussed the procedure including the risks, benefits and alternatives for the proposed anesthesia with the patient or authorized representative who has indicated his/her understanding and acceptance.     Dental Advisory Given  Plan Discussed with: Anesthesiologist, CRNA and Surgeon  Anesthesia Plan Comments: (Patient consented for risks of anesthesia including but not limited to:  - adverse reactions to medications - risk of airway placement if required - damage to eyes, teeth, lips or other oral mucosa - nerve damage due to positioning  - sore throat or hoarseness - Damage to heart, brain, nerves, lungs, other parts of body or loss of life  Patient voiced understanding and assent.)  Anesthesia Quick Evaluation

## 2024-05-01 ENCOUNTER — Encounter: Payer: Self-pay | Admitting: General Surgery

## 2024-05-02 ENCOUNTER — Telehealth: Payer: Self-pay | Admitting: General Surgery

## 2024-05-02 LAB — SURGICAL PATHOLOGY

## 2024-05-02 NOTE — Telephone Encounter (Signed)
 I have tried to call this patient 3 times today to discuss results.  I left a voicemail.  Will try to continue contacting patient to discuss the results and further recommendations.

## 2024-06-26 ENCOUNTER — Ambulatory Visit: Attending: Neurosurgery | Admitting: Occupational Therapy

## 2024-06-26 ENCOUNTER — Ambulatory Visit

## 2024-06-26 DIAGNOSIS — R41841 Cognitive communication deficit: Secondary | ICD-10-CM | POA: Insufficient documentation

## 2024-06-26 DIAGNOSIS — R2689 Other abnormalities of gait and mobility: Secondary | ICD-10-CM | POA: Insufficient documentation

## 2024-06-26 DIAGNOSIS — M6281 Muscle weakness (generalized): Secondary | ICD-10-CM | POA: Insufficient documentation

## 2024-06-26 DIAGNOSIS — R278 Other lack of coordination: Secondary | ICD-10-CM | POA: Diagnosis present

## 2024-06-26 NOTE — Therapy (Signed)
 OUTPATIENT OCCUPATIONAL THERAPY NEURO EVALUATION  Patient Name: Sergio Kennedy MRN: [id number] DOB:08-02-1956, 68 y.o., male Today's Date: 06/26/2024  PCP: Epifanio Alm SQUIBB,  MD REFERRING PROVIDER: Zettie Rosaria HERO, MD  END OF SESSION:  OT End of Session - 06/26/24 1701     Visit Number 1    Number of Visits 24    Date for Recertification  09/18/24    OT Start Time 0845    OT Stop Time 0930    OT Time Calculation (min) 45 min    Behavior During Therapy [facility] for tasks assessed/performed          Past Medical History:  Diagnosis Date   Cancer (HCC)    Class 1 obesity due to excess calories with serious comorbidity and body mass index (BMI) of 34.0 to 34.9 in adult    Diabetes mellitus without complication (HCC)    History of kidney stones    HTN (hypertension), benign    Hyperlipidemia    Hypertension    Hypothyroidism    Hypothyroidism, unspecified    Irregular heart rhythm    Malignant melanoma, unspecified site [facility])    Pre-diabetes    Sleep apnea    Thyroid disease    Past Surgical History:  Procedure Laterality Date   BACK SURGERY  01/2004   COLONOSCOPY N/A 02/15/2024   Procedure: COLONOSCOPY;  Surgeon: Onita Elspeth Sharper, DO;  Location: [facility] ENDOSCOPY;  Service: Gastroenterology;  Laterality: N/A;   EXCISION MASS NECK N/A 04/30/2024   Procedure: EXCISION, MASS, NECK;  Surgeon: Rodolph Romano, MD;  Location: [facility] ORS;  Service: General;  Laterality: N/A;   FOOT SURGERY     melanoma and lymph nodes removed  2023   on back side of the neck   POLYPECTOMY  02/15/2024   Procedure: POLYPECTOMY, INTESTINE;  Surgeon: Onita Elspeth Sharper, DO;  Location: [facility] ENDOSCOPY;  Service: Gastroenterology;;   Patient Active Problem List   Diagnosis Date Noted   Obstructive sleep apnea 05/23/2018   Chronic atrial fibrillation (HCC) 05/23/2018    ONSET DATE: 06/16/24  REFERRING DIAG: Brain Lesion  THERAPY DIAG:   Muscle Weakness Other Lack of  coordination   Rationale for Evaluation and Treatment: Rehabilitation  SUBJECTIVE:   SUBJECTIVE STATEMENT: Pt. is staying with his daughter during recovery Pt accompanied by: Daughter, Burnard  PERTINENT HISTORY: Pt. was diagnosed with a Large Frontal Intraventricular Mass. Pt. Was diagnosed with Melonoma  with Intraventricular Metastasis. MRI Imaging revealed a 3.5cm enhancing lesion in the Right Frontal Horn without Hydrocephalus but abutting the Foramen of Monro, and fornices. Pt. was admitted to the hospital from 10/6-10/10/25 for a right craniotomy for resection.  PRECAUTIONS: No bending , twisting, lifting over 10#  until cleared by neurosurgery.  WEIGHT BEARING RESTRICTIONS: No  PAIN:  Are you having pain? No  FALLS: Has patient fallen in last 6 months? No  LIVING ENVIRONMENT: Lives with: lives alone  Lives in: House/apartment, 2 levels, lives on the 1st floor. Stairs: 1 stoop. Has following equipment at home: shower chair  PLOF: Independent  PATIENT GOALS: To get back to his old self.  OBJECTIVE:  Note: Objective measures were completed at Evaluation unless otherwise noted.  HAND DOMINANCE: Right  ADLs: Overall ADLs: Cues for step-by-step direction for each ADL task Transfers/ambulation related to ADLs: Eating:  Independent Grooming: Independent standing at the sink to complete UB Dressing: MinA LB Dressing: MinA; Assist selecting clothing in the morning Toileting: Independently Bathing: Independent Tub Shower transfers: Supervision   IADLs:  Shopping: Assist and cuing Light housekeeping: Daughter Meal Prep: Able to get a light snack from the fridge Community mobility: Relies on family and friends Medication management: Per daughter-Pt. Is able to identify Meds, and when they need to be taken. Financial management:  Daughter assists Handwriting: 50% legible Work History: Retired from Designer, fashion/clothing; currently working at [employer] Hobbies: Therapist, music   MOBILITY STATUS:  TBD  ACTIVITY TOLERANCE: Activity tolerance:  Fair  FUNCTIONAL OUTCOME MEASURES: TBD  UPPER EXTREMITY ROM:    Active ROM Right Eval WFL Left Eval [facility]  Shoulder flexion    Shoulder abduction    Shoulder adduction    Shoulder extension    Shoulder internal rotation    Shoulder external rotation    Elbow flexion    Elbow extension    Wrist flexion    Wrist extension    Wrist ulnar deviation    Wrist radial deviation    Wrist pronation    Wrist supination    (Blank rows = not tested)  UPPER EXTREMITY MMT:     MMT Right Eval   Left Eval  Shoulder flexion 5/5 4/5  Shoulder abduction 5/5 4/5  Shoulder adduction    Shoulder extension    Shoulder internal rotation    Shoulder external rotation    Middle trapezius    Lower trapezius    Elbow flexion 5/5 4/5  Elbow extension 5/5 4/5  Wrist flexion 5/5 4/5  Wrist extension 5/5 4/5  Wrist ulnar deviation    Wrist radial deviation    Wrist pronation 5/5 4/5  Wrist supination 5/5 4/5  (Blank rows = not tested)  HAND FUNCTION:  Eval:  Grip strength: Right: 70 lbs; Left: 46 lbs, Lateral pinch: Right: 21 lbs, Left: 17 lbs, and 3 point pinch: Right: 14 lbs, Left: 11 lbs  COORDINATION: 9 Hole Peg test: Right: 57 sec; Left: 1 min. &10 sec  SENSATION: WFL  EDEMA: Intact  COGNITION: Overall cognitive status: Impaired  decreased insight into impairments. Per daughter report: limited task initiation for more, and sequencing morning care at home  VISION: Subjective report: Per Pt. report: No changes, per chart  Left visual spatial inattention   VISION ASSESSMENT: To be further assessed in functional context  PERCEPTION: TBD  PRAXIS: TBD                                                                                                                           TREATMENT DATE:06/26/24  OT initial evaluation was completed, and Pt. education was provided as  indicated below.   PATIENT EDUCATION: Education details: OT services, POC, goals and ADL/IADL functional Status.  Person educated: Patient and Child(ren) Education method: Explanation, Demonstration, Tactile cues, and Verbal cues Education comprehension: verbalized understanding, returned demonstration, verbal cues required, and needs further education  HOME EXERCISE PROGRAM:  Continue to assess ongoing need for HEPs, and provide/upgrade as indicated.    GOALS: Goals reviewed with patient? No  SHORT  TERM GOALS: Target date: 08/07/2024  Pt. Will be independent with HEPs for LUE strength, and bilateral FMC  Baseline: Eval:  Goal status: INITIAL   LONG TERM GOALS: Target date: 09/18/2024  Pt. Will improve LUE strength by 2 mm grades to assist with ADLs, Baseline: Eval: Right: 5/5. L: 4/5 Goal status: INITIAL  2.  Pt. Will increase left grip strength by 5# to be able to securely hold items during ADLs/IADLs. Baseline: Eval: Grip strength: Right: 70 lbs; Left: 46 lbs Goal status: INITIAL  3.  Pt. Will increased Left pinch strength by 3# to be able to efficiently open bottles and containers Baseline: Eval: Lateral pinch: Right: 21 lbs, Left: 17 lbs, and 3 point pinch: Right: 14 lbs, Left: 11 lbs Goal status: INITIAL  4.  Pt. Will improve Left [facility] skills by 3 sec. Of speed to be able to efficiently manipulate small objects during ADLs, and IADLs. Baseline: Eval: 9 Hole Peg test: Right: 57 sec; Left: 1 min. &10 sec Goal status: INITIAL  5.  Pt. Will independently independently, and efficiently be able to navigate through his cellphone. Baseline: Eval: Pt. reports pt. has difficulty  Goal status: INITIAL  6.  Pt. Will independently demonstrate compensatory strategies as needed to increase Left sided visual spatial inattention Baseline: Eval: Recent history of left visual spatial inattention Goal status: INITIAL  ASSESSMENT:  CLINICAL IMPRESSION:  Patient is a 68 y.o. male who  was seen today for occupational therapy evaluation for Brain Lesion.  Pt. presents with decreased left UE strength, grip strength, pinch strength, and impaired [facility] skills, impaired sequencing and decreased initiation during morning ADL care, and a recent history  of left visual spatial inattention which limit is ability to efficiently complete basic ADL, and IADL care tasks. Pt. Will benefit from skilled OT services to work on improving LUE functioning, further assess cognitive and visual IADL functioning, and provide education about compensatory strategies as needed in order to increase engagement in, and maximize independence with ADL/IADL tasks.   PERFORMANCE DEFICITS: in functional skills including ADLs, IADLs, coordination, dexterity, proprioception, ROM, strength, pain, Fine motor control, Gross motor control, decreased knowledge of precautions, decreased knowledge of use of DME, vision, and UE functional use, cognitive skills including attention, problem solving, and sequencing, and psychosocial skills including coping strategies, environmental adaptation, interpersonal interactions, and routines and behaviors.   IMPAIRMENTS: are limiting patient from ADLs, IADLs, and leisure.   CO-MORBIDITIES: may have co-morbidities  that affects occupational performance. Patient will benefit from skilled OT to address above impairments and improve overall function.  MODIFICATION OR ASSISTANCE TO COMPLETE EVALUATION: Min-Moderate modification of tasks or assist with assess necessary to complete an evaluation.  OT OCCUPATIONAL PROFILE AND HISTORY: Detailed assessment: Review of records and additional review of physical, cognitive, psychosocial history related to current functional performance.  CLINICAL DECISION MAKING: Moderate - several treatment options, min-mod task modification necessary  REHAB POTENTIAL: Good  EVALUATION COMPLEXITY: Moderate    PLAN:  OT FREQUENCY: 1-2x/week  OT DURATION: 12  weeks  PLANNED INTERVENTIONS: 97168 OT Re-evaluation, 97535 self care/ADL training, 02889 therapeutic exercise, 97530 therapeutic activity, 97112 neuromuscular re-education, 97140 manual therapy, 97010 moist heat, energy conservation, patient/family education, and DME and/or AE instructions  RECOMMENDED OTHER SERVICES: ST referral requested on 10/16, PT  CONSULTED AND AGREED WITH PLAN OF CARE: Patient  PLAN FOR NEXT SESSION: OT/ST  Richardson Otter, MS, OTR/L  06/26/2024, 5:10 PM

## 2024-06-26 NOTE — Therapy (Signed)
 OUTPATIENT PHYSICAL THERAPY NEURO EVALUATION   Patient Name: Sergio Kennedy MRN: [id number] DOB:02-20-1956, 68 y.o., male Today's Date: 06/26/2024   PCP: Epifanio Alm SQUIBB, MD  REFERRING PROVIDER: Zettie Rosaria HERO, MD   END OF SESSION:  PT End of Session - 06/26/24 0929     Visit Number 1    Number of Visits 25    Date for Recertification  09/18/24    PT Start Time 0928    PT Stop Time 1015    PT Time Calculation (min) 47 min    Equipment Utilized During Treatment Gait belt    Activity Tolerance Patient tolerated treatment well    Behavior During Therapy WFL for tasks assessed/performed          Past Medical History:  Diagnosis Date   Cancer (HCC)    Class 1 obesity due to excess calories with serious comorbidity and body mass index (BMI) of 34.0 to 34.9 in adult    Diabetes mellitus without complication (HCC)    History of kidney stones    HTN (hypertension), benign    Hyperlipidemia    Hypertension    Hypothyroidism    Hypothyroidism, unspecified    Irregular heart rhythm    Malignant melanoma, unspecified site [facility])    Pre-diabetes    Sleep apnea    Thyroid disease    Past Surgical History:  Procedure Laterality Date   BACK SURGERY  01/2004   COLONOSCOPY N/A 02/15/2024   Procedure: COLONOSCOPY;  Surgeon: Onita Elspeth Sharper, DO;  Location: [facility] ENDOSCOPY;  Service: Gastroenterology;  Laterality: N/A;   EXCISION MASS NECK N/A 04/30/2024   Procedure: EXCISION, MASS, NECK;  Surgeon: Rodolph Romano, MD;  Location: [facility] ORS;  Service: General;  Laterality: N/A;   FOOT SURGERY     melanoma and lymph nodes removed  2023   on back side of the neck   POLYPECTOMY  02/15/2024   Procedure: POLYPECTOMY, INTESTINE;  Surgeon: Onita Elspeth Sharper, DO;  Location: [facility] ENDOSCOPY;  Service: Gastroenterology;;   Patient Active Problem List   Diagnosis Date Noted   Obstructive sleep apnea 05/23/2018   Chronic atrial fibrillation (HCC) 05/23/2018     ONSET DATE: 06/16/24  REFERRING DIAG:  G93.9 (ICD-10-CM) - Disorder of brain, unspecified  C43.9 (ICD-10-CM) - Metastatic melanoma (HCC)    THERAPY DIAG:  No diagnosis found.  Rationale for Evaluation and Treatment: Rehabilitation  SUBJECTIVE:  SUBJECTIVE STATEMENT: Pt is accompanied by daughter who assists with history and functional level of mobilization.  Pt had a tumor removed on 06/16/24.  Pt reports that he has been able to walk, but it's been more slow and steady throughout the process.  Pt denies any weakness in the LE's. Pt enjoys fishing/hiking and would like to return to doing those activities.  Pt was also working at [employer] part time and would prefer to return to that occupation as well Pt accompanied by: family member, daughter [name]  PERTINENT HISTORY: Pt has hx of cancer and tumor removal via craniectomy on 06/16/24.  Pt does have DM, HTN, sleep apnea, and irregular heat rhythm.  PAIN:  Are you having pain? No  PRECAUTIONS: Other: No bending over, no heavy lifting (>10#), and reduce twisting in the neck.  RED FLAGS: None   WEIGHT BEARING RESTRICTIONS: No  FALLS: Has patient fallen in last 6 months? No  LIVING ENVIRONMENT: Lives with: lives with their daughter until pt is recovered. Lives in: House/apartment Stairs: One step entry Has following equipment at home: Walking stick.  PLOF: Independent  PATIENT GOALS: Be able to improve strength back to baseline levels.    OBJECTIVE: Note: Objective measures were completed at Evaluation unless otherwise noted.  DIAGNOSTIC FINDINGS:   EXAM: CT CERVICAL SPINE WITHOUT CONTRAST  IMPRESSION: No recent fracture is seen in cervical spine. 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   COGNITION: Overall cognitive status: Within functional limits for tasks assessed   SENSATION: WFL  COORDINATION: Pt with good coordination in the LE's, reduced quickness with rapid hand turns, and reduced quickness with fingertip touches.  POSTURE: rounded shoulders and forward head  LOWER EXTREMITY MMT:    MMT Right Eval Left Eval  Hip flexion 5 5  Hip extension    Hip abduction    Hip adduction    Hip internal rotation    Hip external rotation    Knee flexion 5 4+  Knee extension 5 4+  Ankle dorsiflexion 5 4  Ankle plantarflexion    Ankle inversion    Ankle eversion    (Blank rows = not tested)  TRANSFERS: Sit to stand: Complete Independence  Assistive device utilized: None     Stand to sit: Complete Independence  Assistive device utilized: None     Chair to chair: Complete Independence  Assistive device utilized: None       RAMP:  Not tested  CURB:  Not tested  STAIRS: Findings: Level of Assistance: Complete Independence, Stair Negotiation Technique: Alternating Pattern  with Bilateral Rails, Number of Stairs: 4, Height of Stairs: 6   , and Comments: pt steady with ambulation, but slowed similar to walking style. GAIT: Findings: Distance walked: 80' and Comments: Pt with slowed gait pattern and reduce trunk rotation.  Pt has little to no arm swing as well and looks unsure/unsteady when ambulating.  FUNCTIONAL TESTS:  5 times sit to stand: 17.40 sec Timed up and go (TUG): 16.59 sec 6 minute walk test: TBD at next visit 10 meter walk test: 16.95 sec  Dynamic Gait Index: Dynamic Gait Index  Mark the lowest level that applies.   Date Performed 06/26/24  Gait level surface (2) Mild Impairment: Walks 20', uses AD, slower speed, mild gait deviations  2. Change in gait speed (1) Moderate Impairment: Makes only minor adjustments to walking speed, or accomplishes a change in speed with significant gait deviations, or changes speed but has significant  gait deviations, or changes speed but loses balance but is able to recover and continue walking  3. Gait with horizontal head turns (1) Moderate Impairment: Performs head turns with moderate change in gait velocity, slows down, staggers but recovers, can continue to walk  4. Gait with vertical head turns (1) Moderate Impairment: Performs head turns with moderate change in gait velocity, slows down, staggers but recovers, can continue to walk  5. Gait and pivot turn (2) Mild Impairment: Pivot turns safely in > 3 seconds and stops with no loss of balance  6. Step over obstacle (2) Mild Impairment: Is able to step over box, but must slow down and adjust steps to clear box safely  7. Step around obstacle (2) Mild Impairment: Is able to step around both cones, but must slow down and adjust steps to clear cones  8. Steps (2) Mild Impairment: Alternating feet, must use rail  Total score 13/24    Score Interpretation: Score of <19 indicates high risk of falls.  Minimally Clinically Important Difference (MCID):  =DGI scores of<21/24 = 1.80 points DGI scores of >21/24 = 0.60 points   [place] T, Inbar-Borovsky N, Brozgol M, Giladi N, [place] JM. The Dynamic Gait Index in healthy older adults: the role of stair climbing, fear of falling and gender. Gait Posture. 2009 Feb;29(2):237-41. doi: 10.1016/j.gaitpost.2008.08.013. Epub 2008 Oct 8. PMID: 81154560; PMCID: EFR7290501.  Pardasaney, MYRTIS LOIS Bonus, GEANNIE POUR., et al. (2012). Sensitivity to change and responsiveness of four balance measures for community-dwelling older adults. Physical therapy 92(3): 388-397.   PATIENT SURVEYS:  ABC scale: The Activities-Specific Balance Confidence (ABC) Scale 0% 10 20 30  40 50 60 70 80 90 100% No confidence<->completely confident  "How confident are you that you will not lose your balance or become unsteady when you . . .   Date tested 06/26/24  Walk around the house 80%  2. Walk up or down stairs 80%  3. Bend over  and pick up a slipper from in front of a closet floor 10%  4. Reach for a small can off a shelf at eye level 100%  5. Stand on tip toes and reach for something above your head 10%  6. Stand on a chair and reach for something 0%  7. Sweep the floor 80%  8. Walk outside the house to a car parked in the driveway 100%  9. Get into or out of a car 100%  10. Walk across a parking lot to the mall 100%  11. Walk up or down a ramp 100%  12. Walk in a crowded mall where people rapidly walk past you 100%  13. Are bumped into by people as you walk through the mall 100%  14. Step onto or off of an escalator while you are holding onto the railing 100%  15. Step onto or off an escalator while holding onto parcels such that you cannot hold onto the railing 100%  16. Walk outside on icy sidewalks 60%  Total: #/16 76.25%  TREATMENT DATE: 06/26/24  Evaluation   Self-Care/Home Management:  Pt given education on current POC, the findings of the evaluation, and the ways in which skilled therapy can address the current deficits in balance and musculature strength.  Pt instructed on how this can improve their overall function and maintain/improve their overall quality of life.     PATIENT EDUCATION: Education details: Pt educated on role of PT and services provided during current POC, along with prognosis and information about the clinic. Person educated: Patient Education method: Explanation, Demonstration, and Tactile cues Education comprehension: verbalized understanding  HOME EXERCISE PROGRAM: Access Code: [email or web address] URL: [email or web address] Date: 06/26/2024 Prepared by: Sidra Simpers  Exercises - Standing Hip Abduction with Counter Support  - 1 x daily - 3-4 x weekly - 3 sets - 10 reps - Standing Hip Extension with Counter Support  - 1 x daily - 3-4 x  weekly - 3 sets - 10 reps - Standing Knee Flexion with Counter Support  - 1 x daily - 7 x weekly - 3 sets - 10 reps - Standing March with Counter Support  - 1 x daily - 3-4 x weekly - 3 sets - 10 reps - Heel Toe Raises with Unilateral Counter Support  - 1 x daily - 7 x weekly - 3 sets - 10 reps  GOALS: Goals reviewed with patient? Yes  SHORT TERM GOALS: Target date: 07/24/2024  Pt will be independent with HEP in order to demonstrate increased ability to perform tasks related to occupation/hobbies. Baseline: Pt given HEP today and encouraged to perform with reps and sets on the page. Goal status: INITIAL  LONG TERM GOALS: Target date: 09/18/2024  1.  Patient (> 3 years old) will complete five times sit to stand test in < 15 seconds indicating an increased LE strength and improved balance. Baseline: 17.40 sec Goal status: INITIAL  2.  Pt will improve DGI by at least 3 points in order to demonstrate clinically significant improvement in balance and decreased risk for falls Baseline: 13/24 Goal status: INITIAL   3.  Pt will improve ABC by at least 13% in order to demonstrate clinically significant improvement in balance confidence. Baseline: 76.25% (pt's daughter states this number is elevated) Goal status: INITIAL   4.  Patient will reduce timed up and go to <11 seconds to reduce fall risk and demonstrate improved transfer/gait ability. Baseline: 16.59 sec Goal status: INITIAL  5.  Patient will increase 10 meter walk test to >1.67m/s as to improve gait speed for better community ambulation and to reduce fall risk. Baseline: 16.95 Goal status: INITIAL  6.  Patient will increase six minute walk test distance to >1000 for progression to community ambulator and improve gait ability Baseline: TBD at next visit Goal status: INITIAL   ASSESSMENT:  CLINICAL IMPRESSION:  Patient is a 68 y.o. male who was seen today for physical therapy evaluation and treatment s/p craniectomy.  Pt  presents with physical impairments of decreased activity tolerance, reduced balance, speed with walking, and decreased strength in the LE's as noted.  Pt also has misconception that he is able to do things without the risk of falling, however the testing listed/noted above say otherwise.  Daughter is also under the impression that he is much more unsteady and wants him to be able to return to his former self.  Pt will benefit from skilled therapy to address tolerance, balance, and strength impairments necessary for improvement in quality of life.  Pt. demonstrates understanding of this plan  of care and agrees with this plan.    OBJECTIVE IMPAIRMENTS: Abnormal gait, decreased activity tolerance, decreased balance, decreased cognition, decreased coordination, decreased endurance, decreased knowledge of condition, decreased mobility, difficulty walking, decreased strength, and obesity.   ACTIVITY LIMITATIONS: carrying, lifting, bending, squatting, stairs, and locomotion level  PARTICIPATION LIMITATIONS: cleaning, laundry, driving, shopping, community activity, and occupation  PERSONAL FACTORS: Age, Fitness, Past/current experiences, Profession, Time since onset of injury/illness/exacerbation, and 3+ comorbidities: Hx of cancer, DM II, HTN, sleep apnea are also affecting patient's functional outcome.   REHAB POTENTIAL: Good  CLINICAL DECISION MAKING: Evolving/moderate complexity  EVALUATION COMPLEXITY: Moderate  PLAN:  PT FREQUENCY: 1-2x/week  PT DURATION: 12 weeks  PLANNED INTERVENTIONS: 97750- Physical Performance Testing, 97110-Therapeutic exercises, 97530- Therapeutic activity, [id number]- Neuromuscular re-education, 97535- Self Care, 02859- Manual therapy, [phone]- Gait training, [phone]- Canalith repositioning, Patient/Family education, Balance training, Stair training, Vestibular training, Visual/preceptual remediation/compensation, Cryotherapy, and Moist heat  PLAN FOR NEXT SESSION:   Continue  assessment with 6-minute walk test and any balance testing that is appropriate Assess HEP and add additional balance related tasks to existing one.    Fonda Simpers, PT, DPT Physical Therapist - [facility]  06/26/24, 9:29 AM

## 2024-07-01 NOTE — Progress Notes (Signed)
 I called Sergio Kennedy to follow up on the MyChart message and CT scan appointment scheduled for 10/23.  He was unavailable, so I left a voice message.

## 2024-07-02 ENCOUNTER — Ambulatory Visit

## 2024-07-02 ENCOUNTER — Ambulatory Visit: Admitting: Speech Pathology

## 2024-07-02 DIAGNOSIS — R278 Other lack of coordination: Secondary | ICD-10-CM

## 2024-07-02 DIAGNOSIS — M6281 Muscle weakness (generalized): Secondary | ICD-10-CM

## 2024-07-02 DIAGNOSIS — R2689 Other abnormalities of gait and mobility: Secondary | ICD-10-CM

## 2024-07-02 DIAGNOSIS — R41841 Cognitive communication deficit: Secondary | ICD-10-CM

## 2024-07-02 NOTE — Therapy (Signed)
 OUTPATIENT OCCUPATIONAL THERAPY NEURO TREATMENT NOTE  Patient Name: Sergio Kennedy MRN: [id number] DOB:1956-07-02, 68 y.o., male Today's Date: 07/06/2024  PCP: Epifanio Alm SQUIBB,  MD REFERRING PROVIDER: Zettie Rosaria HERO, MD  END OF SESSION:  OT End of Session - 07/06/24 2033     Visit Number 2    Number of Visits 24    Date for Recertification  09/18/24    OT Start Time 1100    OT Stop Time 1145    OT Time Calculation (min) 45 min    Activity Tolerance Patient tolerated treatment well    Behavior During Therapy [facility] for tasks assessed/performed         Past Medical History:  Diagnosis Date   Cancer (HCC)    Class 1 obesity due to excess calories with serious comorbidity and body mass index (BMI) of 34.0 to 34.9 in adult    Diabetes mellitus without complication (HCC)    History of kidney stones    HTN (hypertension), benign    Hyperlipidemia    Hypertension    Hypothyroidism    Hypothyroidism, unspecified    Irregular heart rhythm    Malignant melanoma, unspecified site [facility])    Pre-diabetes    Sleep apnea    Thyroid disease    Past Surgical History:  Procedure Laterality Date   BACK SURGERY  01/2004   COLONOSCOPY N/A 02/15/2024   Procedure: COLONOSCOPY;  Surgeon: Onita Elspeth Sharper, DO;  Location: [facility] ENDOSCOPY;  Service: Gastroenterology;  Laterality: N/A;   EXCISION MASS NECK N/A 04/30/2024   Procedure: EXCISION, MASS, NECK;  Surgeon: Rodolph Romano, MD;  Location: [facility] ORS;  Service: General;  Laterality: N/A;   FOOT SURGERY     melanoma and lymph nodes removed  2023   on back side of the neck   POLYPECTOMY  02/15/2024   Procedure: POLYPECTOMY, INTESTINE;  Surgeon: Onita Elspeth Sharper, DO;  Location: [facility] ENDOSCOPY;  Service: Gastroenterology;;   Patient Active Problem List   Diagnosis Date Noted   Obstructive sleep apnea 05/23/2018   Chronic atrial fibrillation (HCC) 05/23/2018   ONSET DATE: 06/16/24  REFERRING DIAG: Brain  Lesion  THERAPY DIAG:  Muscle Weakness Other Lack of coordination  Rationale for Evaluation and Treatment: Rehabilitation  SUBJECTIVE:   SUBJECTIVE STATEMENT: Daughter reports that immunotherapy infusions will begin Friday this week, and pt will go for an MRI to identify target areas for radiation therapy.  Radiation likely to begin first part of Nov.   Pt accompanied by: Daughter, Burnard  PERTINENT HISTORY: Pt. was diagnosed with a Large Frontal Intraventricular Mass. Pt. Was diagnosed with Melonoma  with Intraventricular Metastasis. MRI Imaging revealed a 3.5cm enhancing lesion in the Right Frontal Horn without Hydrocephalus but abutting the Foramen of Monro, and fornices. Pt. was admitted to the hospital from 10/6-10/10/25 for a right craniotomy for resection.  PRECAUTIONS: No bending , twisting, lifting over 10#  until cleared by neurosurgery.  WEIGHT BEARING RESTRICTIONS: No  PAIN:  Are you having pain? No  FALLS: Has patient fallen in last 6 months? No  LIVING ENVIRONMENT: Lives with: lives alone  Lives in: House/apartment, 2 levels, lives on the 1st floor. Stairs: 1 stoop. Has following equipment at home: shower chair  PLOF: Independent  PATIENT GOALS: To get back to his old self.  OBJECTIVE:  Note: Objective measures were completed at Evaluation unless otherwise noted.  HAND DOMINANCE: Right  ADLs: Overall ADLs: Cues for step-by-step direction for each ADL task Transfers/ambulation related to ADLs: Eating:  Independent Grooming: Independent standing at the sink to complete UB Dressing: MinA LB Dressing: MinA; Assist selecting clothing in the morning Toileting: Independently Bathing: Independent Tub Shower transfers: Supervision  IADLs: Shopping: Assist and cuing Light housekeeping: Daughter Meal Prep: Able to get a light snack from the fridge Community mobility: Relies on family and friends Medication management: Per daughter-Pt. Is able to identify  Meds, and when they need to be taken. Financial management:  Daughter assists Handwriting: 50% legible Work History: Retired from designer, fashion/clothing; currently working at [employer] Hobbies: Therapist, Music  MOBILITY STATUS:  TBD  ACTIVITY TOLERANCE: Activity tolerance:  Fair  FUNCTIONAL OUTCOME MEASURES: TBD  UPPER EXTREMITY ROM:    Active ROM Right Eval WFL Left Eval [facility]  Shoulder flexion    Shoulder abduction    Shoulder adduction    Shoulder extension    Shoulder internal rotation    Shoulder external rotation    Elbow flexion    Elbow extension    Wrist flexion    Wrist extension    Wrist ulnar deviation    Wrist radial deviation    Wrist pronation    Wrist supination    (Blank rows = not tested)  UPPER EXTREMITY MMT:     MMT Right Eval   Left Eval  Shoulder flexion 5/5 4/5  Shoulder abduction 5/5 4/5  Shoulder adduction    Shoulder extension    Shoulder internal rotation    Shoulder external rotation    Middle trapezius    Lower trapezius    Elbow flexion 5/5 4/5  Elbow extension 5/5 4/5  Wrist flexion 5/5 4/5  Wrist extension 5/5 4/5  Wrist ulnar deviation    Wrist radial deviation    Wrist pronation 5/5 4/5  Wrist supination 5/5 4/5  (Blank rows = not tested)  HAND FUNCTION:  Eval: Grip strength: Right: 70 lbs; Left: 46 lbs, Lateral pinch: Right: 21 lbs, Left: 17 lbs, and 3 point pinch: Right: 14 lbs, Left: 11 lbs  COORDINATION: 9 Hole Peg test: Right: 57 sec; Left: 1 min. &10 sec  SENSATION: WFL  EDEMA: Intact  COGNITION: Overall cognitive status: Impaired  decreased insight into impairments. Per daughter report: limited task initiation for more, and sequencing morning care at home  VISION: Subjective report: Per Pt. report: No changes, per chart  Left visual spatial inattention  VISION ASSESSMENT:  07/02/24 Ocular ROM: [facility] Tracking/Visual pursuits: Decreased smoothness of eye movement to Left superior  field and Decreased smoothness of eye movement to Left inferior field Saccades: additional eye shifts occurred during testing, undershoots, and decreased speed of saccadic movements Visual Fields: no apparent deficits  PERCEPTION: Pt presents with mild L sided inattention  PRAXIS: TBD                                                                                                                           TREATMENT DATE:07/02/24 Therapeutic Activity: -Visual assessment completed; see above  -Issued green theraputty  and instructed pt in strengthening and coordination exercises for L hand, including gross grasping, lateral/2 point/3 point pinching, digit abd/add, and digging coins out of putty.  Able to return demo with intermittent min vc for technique to improve quality of movement.  Encouraged completion 5-15 min, 1-2x per day.    Self Care: -Condition management education: L sided inattention and potential demonstrations of this during ADL/IADL tasks.  Recommendations for family/friends to sit to pt's L when visiting/eating to increase pt's attention to L visual field.  -HEP review: Theraputty visual handout reviewed with pt/daughter with pt showing good ability to return demo with intermittent min vc for accuracy.  PATIENT EDUCATION: Education details: L sided inattention, theraputty exercises Person educated: Patient and Child(ren) Education method: Explanation, Demonstration, Tactile cues, Verbal cues, and Handouts Education comprehension: verbalized understanding, returned demonstration, verbal cues required, and needs further education  HOME EXERCISE PROGRAM: Landy theraputty   GOALS: Goals reviewed with patient? No  SHORT TERM GOALS: Target date: 08/07/2024  Pt. Will be independent with HEPs for LUE strength, and bilateral FMC  Baseline: Eval:  Goal status: INITIAL   LONG TERM GOALS: Target date: 09/18/2024  Pt. Will improve LUE strength by 2 mm grades to assist with  ADLs, Baseline: Eval: Right: 5/5. L: 4/5 Goal status: INITIAL  2.  Pt. Will increase left grip strength by 5# to be able to securely hold items during ADLs/IADLs. Baseline: Eval: Grip strength: Right: 70 lbs; Left: 46 lbs Goal status: INITIAL  3.  Pt. Will increased Left pinch strength by 3# to be able to efficiently open bottles and containers Baseline: Eval: Lateral pinch: Right: 21 lbs, Left: 17 lbs, and 3 point pinch: Right: 14 lbs, Left: 11 lbs Goal status: INITIAL  4.  Pt. Will improve Left [facility] skills by 3 sec. Of speed to be able to efficiently manipulate small objects during ADLs, and IADLs. Baseline: Eval: 9 Hole Peg test: Right: 57 sec; Left: 1 min. &10 sec Goal status: INITIAL  5.  Pt. Will independently independently, and efficiently be able to navigate through his cellphone. Baseline: Eval: Pt. reports pt. has difficulty  Goal status: INITIAL  6.  Pt. Will independently demonstrate compensatory strategies as needed to increase Left sided visual spatial inattention Baseline: Eval: Recent history of left visual spatial inattention Goal status: INITIAL  ASSESSMENT: CLINICAL IMPRESSION: Visual assessment initiated today; pt presenting with mild L sided inattention.  Pt/daughter receptive to education as noted above.  Initiated green theraputty HEP.  Pt was issued visual handout for theraputty and given review for carry over.  Will continue to review as needed.   Pt will continue to benefit from skilled OT services to work on improving LUE functioning and to provide education on compensatory strategies as needed in order to increase engagement in, and maximize independence with ADL/IADL tasks.   PERFORMANCE DEFICITS: in functional skills including ADLs, IADLs, coordination, dexterity, proprioception, ROM, strength, pain, Fine motor control, Gross motor control, decreased knowledge of precautions, decreased knowledge of use of DME, vision, and UE functional use, cognitive skills  including attention, problem solving, and sequencing, and psychosocial skills including coping strategies, environmental adaptation, interpersonal interactions, and routines and behaviors.   IMPAIRMENTS: are limiting patient from ADLs, IADLs, and leisure.   CO-MORBIDITIES: may have co-morbidities  that affects occupational performance. Patient will benefit from skilled OT to address above impairments and improve overall function.  MODIFICATION OR ASSISTANCE TO COMPLETE EVALUATION: Min-Moderate modification of tasks or assist with assess necessary to complete an  evaluation.  OT OCCUPATIONAL PROFILE AND HISTORY: Detailed assessment: Review of records and additional review of physical, cognitive, psychosocial history related to current functional performance.  CLINICAL DECISION MAKING: Moderate - several treatment options, min-mod task modification necessary  REHAB POTENTIAL: Good  EVALUATION COMPLEXITY: Moderate    PLAN:  OT FREQUENCY: 1-2x/week  OT DURATION: 12 weeks  PLANNED INTERVENTIONS: 97168 OT Re-evaluation, 97535 self care/ADL training, 02889 therapeutic exercise, 97530 therapeutic activity, 97112 neuromuscular re-education, 97140 manual therapy, 97010 moist heat, energy conservation, patient/family education, and DME and/or AE instructions  RECOMMENDED OTHER SERVICES: ST referral requested on 10/16, PT  CONSULTED AND AGREED WITH PLAN OF CARE: Patient  PLAN FOR NEXT SESSION: see above  Inocente Blazing, MS, OTR/L  07/06/2024, 8:34 PM

## 2024-07-02 NOTE — Therapy (Unsigned)
 OUTPATIENT SPEECH LANGUAGE PATHOLOGY  COGNITION EVALUATION   Patient Name: Sergio Kennedy MRN: [id number] DOB:07-Jul-1956, 68 y.o., male Today's Date: 07/02/2024  PCP: Alm Needle, MD REFERRING PROVIDER: Rosaria CHRISTELLA Lush, MD   End of Session - 07/02/24 1156     Visit Number 1    Number of Visits 25    Date for Recertification  09/24/24    Authorization Type [employer] HMO PPO    Progress Note Due on Visit 10    SLP Start Time 1010    SLP Stop Time  1100    SLP Time Calculation (min) 50 min    Activity Tolerance Patient tolerated treatment well          Past Medical History:  Diagnosis Date   Cancer (HCC)    Class 1 obesity due to excess calories with serious comorbidity and body mass index (BMI) of 34.0 to 34.9 in adult    Diabetes mellitus without complication (HCC)    History of kidney stones    HTN (hypertension), benign    Hyperlipidemia    Hypertension    Hypothyroidism    Hypothyroidism, unspecified    Irregular heart rhythm    Malignant melanoma, unspecified site [facility])    Pre-diabetes    Sleep apnea    Thyroid disease    Past Surgical History:  Procedure Laterality Date   BACK SURGERY  01/2004   COLONOSCOPY N/A 02/15/2024   Procedure: COLONOSCOPY;  Surgeon: Onita Elspeth Sharper, DO;  Location: [facility] ENDOSCOPY;  Service: Gastroenterology;  Laterality: N/A;   EXCISION MASS NECK N/A 04/30/2024   Procedure: EXCISION, MASS, NECK;  Surgeon: Rodolph Romano, MD;  Location: [facility] ORS;  Service: General;  Laterality: N/A;   FOOT SURGERY     melanoma and lymph nodes removed  2023   on back side of the neck   POLYPECTOMY  02/15/2024   Procedure: POLYPECTOMY, INTESTINE;  Surgeon: Onita Elspeth Sharper, DO;  Location: [facility] ENDOSCOPY;  Service: Gastroenterology;;   Patient Active Problem List   Diagnosis Date Noted   Obstructive sleep apnea 05/23/2018   Chronic atrial fibrillation (HCC) 05/23/2018    ONSET DATE: 06/16/2024   REFERRING DIAG:   G93.9 (ICD-10-CM) - Disorder of brain, unspecified  C43.9 (ICD-10-CM) - Malignant melanoma of skin, unspecified    THERAPY DIAG:  Cognitive communication deficit  Rationale for Evaluation and Treatment Rehabilitation  SUBJECTIVE:   SUBJECTIVE STATEMENT: Pt pleasant, mildly flat affect, accompanied by his daughter Sergio Kennedy) Pt accompanied by: self and family member  PERTINENT HISTORY and DIAGNOSTIC FINDINGS: : Pt is a 68 year old male with past medical history of melanoma with right intraventricular metastasis. Pt had right craniotomy for tumor resection on 06/16/2024.   Medical Tests / Procedures Comments: MRI 10/6: Postoperative changes of right frontoparietal craniotomy for mass resection with postoperative blood products within the resection cavity and surgical tract.    Layering blood products in the supratentorial and infratentorial ventricular system without hydrocephalus.    Mild linear and nodular enhancement along the cavity margins, nonspecific, could reflect postsurgical inflammatory changes. Attention on follow-up.    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  Pt with Cognitive Linguistic Evaluation while admitted at [facility] on 06/17/2024. Per chart, pt tested [facility] across cognitive-linguistic domains assessed during testing.  PAIN:  Are you having pain? No   FALLS: Has patient fallen in last 6 months?  See PT evaluation for details  LIVING ENVIRONMENT: Lives with: lives with their daughter Lives in:  House/apartment  PLOF:  Level of assistance: Independent with ADLs, Independent with IADLs Employment: Full-time employment   PATIENT GOALS   to improve cognitive function for return to independent living, driving and working  OBJECTIVE:   COGNITIVE COMMUNICATION Overall cognitive status: Impaired Areas of impairment:  Oriented to person Oriented to place Oriented to time Attention: Impaired: Selective, Alternating, Divided Memory:  Impaired: Working Teacher, music term Prospective Auditory Awareness: Impaired: Intellectual, Emergent, Anticipatory, and Neglect Executive function: Impaired: Initiation, Problem solving, Organization, Planning, Error awareness, Self-correction, and Slow processing Behavior: Perseveration Impaired Functional Impairments: Pt was accompanied by his daughter. Pt appears unaware of cognitive deficits with his daughter being primary historian. She report deficits with short term memory, tendency to fixate and concerns with executive functioning (she states looking up location of tumor and impact on cognitive function, as such she voiced concerns with pt's ability to perform executive functions). Post surgery, pt is residing with his daughter and is currently reliant on her for medication management, financial planning, recall of appt dates/times.  AUDITORY COMPREHENSION  Overall auditory comprehension: Appears intact YES/NO questions: Appears intact Following directions: Appears intact Conversation: Simple  READING COMPREHENSION: to be assessed in future session  EXPRESSION: verbal  VERBAL EXPRESSION:   Overall verbal expression: Appears intact Level of generative/spontaneous verbalization: conversation Automatic speech: name: intact and social response: intact  Repetition: Appears intact Naming: Responsive: 76-100% and Divergent: 26-50% and impacted by deficits in working [employer]: Impaired: abnormal effect, dysprosody, eye contact, and monotone Non-verbal means of communication: N/A  WRITTEN EXPRESSION: Dominant hand: right Written expression: to be assessed in future sessions  ORAL MOTOR EXAMINATION Facial : WFL Lingual: WFL Velum: WFL Mandible: WFL Cough: WFL Voice: Hoarse, Breathy, Weak  MOTOR SPEECH: Overall motor speech: Appears intact Phonation: breathy, hoarse, and low vocal intensity Resonance: WFL Articulation: Appears intact Intelligibility:  Intelligible Motor planning: Appears intact   STANDARDIZED ASSESSMENTS: The Repeatable Battery for the Assessment of Neuropsychological Status Update (RBANS-Update) is a brief, individually administered test measuring attention, language, visuospatial/constructional abilities, and immediate and delayed memory. The test comprises 12 subtests that are intended for use with adolescents to adults, ages 50 to 63 years. Normative scores were developed using a stratified, nationally representative sample of 690 healthy adolescents and adults. RBANS yields index standard scores based on subtest raw scores. RBANS index scores are metrically scaled, with a mean of 100 and a standard deviation of 15 for each age group.    Immediate Memory Index Score: 81: Low Average (Index Score 80-89)  Total Domain Score List Learning 20/40 Story Memory 16/24  Visuospatial/Constructional Index Score: 78: Borderline (Index Score 70-79)  Total Domain Score Figure Copy 3/20 Line Orientation 18/20   Language Index Score: 85: Low Average (Index Score 80-89)  Total Domain Score Picture Naming 10/10 Semantic Fluency 12/40   Attention Index Score: 64: Extremely Low (Index Score 69 and below)  Total Domain Score Digit Span 9/16  Coding 10/89   Delayed Memory Index Score: 81: Low Average (Index Score 80-89)  Total Domain Score List Recall 5/10 List Recognition 19/20  Story Recall 4/12 Figure Recall 0/20   Total Scale is 72 which is ~ 2 SDs below the mean of 100.   PATIENT REPORTED OUTCOME MEASURES (PROM): To be completed over the next 3 sessions   TODAY'S TREATMENT:  Skilled education provided with visual demonstration of portions of RBANs showing left visual field neglect/inattention. Pt largely dismissive and aware of not drawing entire left side of diagram or completing left  side of coding. Recommend pt's daughter help him avoid hitting objects on his left.    PATIENT EDUCATION: Education details:  cognitive communication, personality and left inattention/visual field deficits with site of lesion Person educated: Patient and Child(ren) Education method: Medical illustrator Education comprehension: verbalized understanding and needs further education   HOME EXERCISE PROGRAM:  Recommend pt's daughter position herself to pt's left when walking out in community   GOALS:  Goals reviewed with patient? Yes  SHORT TERM GOALS: Target date: 10 sessions  With Moderate A to utilize finger scanning technique to attend to the left most side of the page, patient will read page-level information from a novel with 50% accuracy.  Baseline: Goal status: INITIAL  2.  With moderate A, pt will complete complex medication management task with 75% accuracy.  Baseline:  Goal status: INITIAL  3.  With moderate A, pt will complex money management task with 75& accuracy.  Baseline:  Goal status: INITIAL  4.  With moderate A, pt will demonstrate emergent awareness by self-monitoring and self-correcting errors in 90% of opportunities.  Baseline:  Goal status: INITIAL   LONG TERM GOALS: Target date: 09/24/2024  With Supervision A, patient will demonstrate ability to complete complex reasoning tasks with 75% accuracy.  Baseline:  Goal status: INITIAL  2.  With Min A, pt will demonstrate anticipatory awareness by providing solution for hypothetical safety situation in 5 out of 7 opportunities.  Baseline:  Goal status: INITIAL  3.  Pt will report improved cognitive communication via PROM by 5 points at last ST session   Baseline:  Goal status: INITIAL   ASSESSMENT:  CLINICAL IMPRESSION: Patient is a 67 y.o. male  who was seen today for a cognitive communication evaluation d/t recent frontoparietal craniotomy for mass resection. He presents with moderate cognitive impairment c/b severe deficits in memory (short term, working memory, prospective memory), attention (selective and left  sided), higher level problem solving, reasoning, executive functions and emergent awareness.   He also presents with mildly hoarse vocal quality following intubation for above mentioned craniotomy. Will continue to monitor.   OBJECTIVE IMPAIRMENTS include attention, memory, awareness, and executive functioning. These impairments are limiting patient from return to work, managing medications, managing appointments, managing finances, household responsibilities, ADLs/IADLs, and effectively communicating at home and in community. Factors affecting potential to achieve goals and functional outcome are medical prognosis, severity of impairments, and future radiation and infusions. Patient will benefit from skilled SLP services to address above impairments and improve overall function.  REHAB POTENTIAL: Good  PLAN: SLP FREQUENCY: 1-2x/week  SLP DURATION: 12 weeks  PLANNED INTERVENTIONS: Environmental controls, Cueing hierachy, Cognitive reorganization, Internal/external aids, Functional tasks, SLP instruction and feedback, Compensatory strategies, and Patient/family education    Nilay Mangrum B. Rubbie, M.S., CCC-SLP, Tree surgeon Certified Brain Injury Specialist [facility]  [facility] Rehabilitation Services Office [phone] Ascom [phone] Fax [phone]

## 2024-07-02 NOTE — Therapy (Signed)
 OUTPATIENT PHYSICAL THERAPY NEURO TREATMENT   Patient Name: Sergio Kennedy MRN: [id number] DOB:May 25, 1956, 68 y.o., male Today's Date: 07/02/2024   PCP: Epifanio Alm SQUIBB, MD  REFERRING PROVIDER: Zettie Rosaria HERO, MD   END OF SESSION:  PT End of Session - 07/02/24 1145     Visit Number 2    Number of Visits 25    Date for Recertification  09/18/24    PT Start Time 1145    PT Stop Time 1230    PT Time Calculation (min) 45 min    Equipment Utilized During Treatment Gait belt    Activity Tolerance Patient tolerated treatment well    Behavior During Therapy WFL for tasks assessed/performed           Past Medical History:  Diagnosis Date   Cancer (HCC)    Class 1 obesity due to excess calories with serious comorbidity and body mass index (BMI) of 34.0 to 34.9 in adult    Diabetes mellitus without complication (HCC)    History of kidney stones    HTN (hypertension), benign    Hyperlipidemia    Hypertension    Hypothyroidism    Hypothyroidism, unspecified    Irregular heart rhythm    Malignant melanoma, unspecified site [facility])    Pre-diabetes    Sleep apnea    Thyroid disease    Past Surgical History:  Procedure Laterality Date   BACK SURGERY  01/2004   COLONOSCOPY N/A 02/15/2024   Procedure: COLONOSCOPY;  Surgeon: Onita Elspeth Sharper, DO;  Location: [facility] ENDOSCOPY;  Service: Gastroenterology;  Laterality: N/A;   EXCISION MASS NECK N/A 04/30/2024   Procedure: EXCISION, MASS, NECK;  Surgeon: Rodolph Romano, MD;  Location: [facility] ORS;  Service: General;  Laterality: N/A;   FOOT SURGERY     melanoma and lymph nodes removed  2023   on back side of the neck   POLYPECTOMY  02/15/2024   Procedure: POLYPECTOMY, INTESTINE;  Surgeon: Onita Elspeth Sharper, DO;  Location: [facility] ENDOSCOPY;  Service: Gastroenterology;;   Patient Active Problem List   Diagnosis Date Noted   Obstructive sleep apnea 05/23/2018   Chronic atrial fibrillation (HCC) 05/23/2018     ONSET DATE: 06/16/24  REFERRING DIAG:  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  Pt's daughter supports that the pt is not usually cold, but hot most of the time, so this is unusual for him.    Per EVAL: Pt is accompanied by daughter who assists with history and functional level of mobilization.  Pt had a tumor removed on 06/16/24.  Pt reports that he has been able to walk, but it's been more slow and steady throughout the process.  Pt denies any weakness in the LE's. Pt enjoys fishing/hiking and would like to return to doing those activities.  Pt was also working at [employer] part time and would prefer to return to that occupation as well  Pt accompanied by: family member, daughter [name]  PERTINENT HISTORY: Pt has hx of cancer and tumor removal via craniectomy on 06/16/24.  Pt does have DM, HTN, sleep apnea, and irregular heat rhythm.  PAIN:  Are you having pain? No  PRECAUTIONS: Other: No bending over, no heavy lifting (>10#), and reduce twisting in the neck.  RED FLAGS: None   WEIGHT BEARING RESTRICTIONS: No  FALLS: Has patient fallen in last 6 months? No  LIVING ENVIRONMENT: Lives with: lives with their daughter until pt is recovered. Lives in: House/apartment Stairs: One step entry Has following equipment at home: Walking stick.  PLOF: Independent  PATIENT GOALS: Be able to improve strength back to  baseline levels.    OBJECTIVE: Note: Objective measures were completed at Evaluation unless otherwise noted.  DIAGNOSTIC FINDINGS:   EXAM: CT CERVICAL SPINE WITHOUT CONTRAST  IMPRESSION: No recent fracture is seen in cervical spine. 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   COGNITION: Overall cognitive status: Within functional limits for tasks assessed   SENSATION: WFL  COORDINATION: Pt with good coordination in the LE's, reduced quickness with rapid hand turns, and reduced quickness with fingertip touches.  POSTURE: rounded shoulders and forward head  LOWER EXTREMITY MMT:    MMT Right Eval Left Eval  Hip flexion 5 5  Hip extension    Hip abduction    Hip adduction    Hip internal rotation    Hip external rotation    Knee flexion 5 4+  Knee extension 5 4+  Ankle dorsiflexion 5 4  Ankle plantarflexion    Ankle inversion    Ankle eversion    (Blank rows = not tested)  TRANSFERS: Sit to stand: Complete Independence  Assistive device utilized: None     Stand to sit: Complete Independence  Assistive device utilized: None     Chair to chair: Complete Independence  Assistive device utilized: None       RAMP:  Not tested  CURB:  Not tested  STAIRS: Findings: Level of Assistance: Complete Independence, Stair Negotiation Technique: Alternating Pattern  with Bilateral Rails, Number of Stairs: 4, Height of Stairs: 6   , and Comments: pt steady with ambulation, but slowed similar to walking style. GAIT: Findings: Distance walked: 74' and Comments: Pt with slowed gait pattern and reduce trunk rotation.  Pt has little to no arm swing as well and looks unsure/unsteady when ambulating.  FUNCTIONAL TESTS:  5 times sit to stand: 17.40 sec Timed up and go (TUG): 16.59 sec 6 minute walk test: TBD at next visit 10 meter walk test: 16.95 sec  Dynamic Gait Index: Dynamic Gait Index  Mark the lowest level that applies.   Date  Performed 06/26/24  Gait level surface (2) Mild Impairment: Walks 20', uses AD, slower speed, mild gait deviations  2. Change in gait speed (1) Moderate Impairment: Makes only minor adjustments to walking speed, or accomplishes a change in speed with significant gait deviations, or changes speed but has significant gait deviations, or changes speed but loses balance but is able to recover and continue walking  3. Gait with horizontal head turns (1) Moderate Impairment: Performs head turns with moderate change in gait velocity, slows down, staggers but recovers, can continue to walk  4. Gait with vertical head turns (1) Moderate Impairment: Performs head turns with moderate change in gait velocity, slows down, staggers but recovers, can continue to walk  5. Gait and pivot turn (2) Mild Impairment: Pivot turns safely in > 3 seconds and stops with no loss of balance  6. Step over obstacle (2) Mild Impairment: Is able to step over box, but must slow down and adjust steps to clear box safely  7. Step around obstacle (2) Mild Impairment: Is able to step around both cones, but must slow down and adjust steps to clear cones  8. Steps (2) Mild Impairment: Alternating feet, must use rail  Total score 13/24    Score Interpretation: Score of <19 indicates high risk of falls.  Minimally Clinically Important Difference (MCID):  =DGI scores of<21/24 = 1.80 points DGI scores of >21/24 = 0.60 points   Tuscola T, Inbar-Borovsky N, Brozgol M, Giladi N, [place] JM. The Dynamic Gait Index in healthy older adults: the role of stair climbing, fear of falling and gender. Gait Posture. 2009 Feb;29(2):237-41. doi: 10.1016/j.gaitpost.2008.08.013. Epub 2008 Oct 8. PMID: [id number]; PMCID: EFR7290501.  Pardasaney, MYRTIS LOIS Bonus, GEANNIE POUR., et al. (2012). Sensitivity to change and responsiveness of four balance measures for community-dwelling older adults. Physical therapy 92(3): 388-397.    BERG:  Item Test date:  07/02/24   Sitting to standing 4. able to stand without using hands and stabilize independently  2. Standing unsupported 4. able to stand safely for 2 minutes  3. Sitting with back unsupported, feet supported 4. able to sit safely and securely for 2 minutes  4. Standing to sitting 4. sits safely with minimal use of hands  5. Pivot transfer  4. able to transfer safely with minor use of hands  6. Standing unsupported with eyes closed 4. able to stand 10 seconds safely  7. Standing unsupported with feet together 4. able to place feet together independently and stand 1 minute safely  8. Reaching forward with outstretched arms while standing 4. can reach forward confidently 25 cm (10 inches)  9. Pick up object from the floor from standing 4. able to pick up slipper safely and easily  10. Turning to look behind over left and right shoulders while standing 4. looks behind from both sides and weight shifts well  11. Turn 360 degrees 2. able to turn 360 degrees safely but slowly  12. Place alternate foot on step or stool while standing unsupported 4. ble to stand independently and safely and complete 8 steps in 20 seconds  13. Standing unsupported one foot in front 3. able to place foot ahead independently and hold 30 seconds  14. Standing on one leg 2. able to lift leg independently and hold >= 3 seconds    Total Score 51/56    PATIENT SURVEYS:  ABC scale: The Activities-Specific Balance Confidence (ABC) Scale 0% 10 20 30  40 50 60 70 80 90 100% No confidence<->completely confident  "  How confident are you that you will not lose your balance or become unsteady when you . . .   Date tested 06/26/24  Walk around the house 80%  2. Walk up or down stairs 80%  3. Bend over and pick up a slipper from in front of a closet floor 10%  4. Reach for a small can off a shelf at eye level 100%  5. Stand on tip toes and reach for something above your head 10%  6. Stand on a chair and reach for something 0%   7. Sweep the floor 80%  8. Walk outside the house to a car parked in the driveway 100%  9. Get into or out of a car 100%  10. Walk across a parking lot to the mall 100%  11. Walk up or down a ramp 100%  12. Walk in a crowded mall where people rapidly walk past you 100%  13. Are bumped into by people as you walk through the mall 100%  14. Step onto or off of an escalator while you are holding onto the railing 100%  15. Step onto or off an escalator while holding onto parcels such that you cannot hold onto the railing 100%  16. Walk outside on icy sidewalks 60%  Total: #/16 76.25%                                                                                                                                 TREATMENT DATE: 07/02/24  Physical Performance Testing:  6 Min Walk Test:  Instructed patient to ambulate as quickly and as safely as possible for 6 minutes using LRAD. Patient was allowed to take standing rest breaks without stopping the test, but if the patient required a sitting rest break the clock would be stopped and the test would be over.  Results: 563 feet using no AD with supervision. Results indicate that the patient has reduced endurance with ambulation compared to age matched norms.  Age Matched Norms (in meters): 61-69 yo M: 63 F: 38, 84-79 yo M: 67 F: 471, 93-89 yo M: 417 F: 392 MDC: 58.21 meters (190.98 feet) or 50 meters (ANPTA Core Set of Outcome Measures for Adults with Neurologic Conditions, 2018)  Patient demonstrates increased fall risk as noted by score of  51/56 on Berg Balance Scale.  (<36= high risk for falls, close to 100%; 37-45 significant >80%; 46-51 moderate >50%; 52-55 lower >25%)     TherAct: To improve functional movements patterns for everyday tasks  Forward step ups onto 6 step with no UE support, x10 each LE leading  STS x10  STS with staggered stance, x10 each LE leading     PATIENT EDUCATION: Education details: Pt educated on  role of PT and services provided during current POC, along with prognosis and information about the clinic. Person educated: Patient Education method: Explanation, Demonstration, and Tactile cues Education comprehension: verbalized understanding  HOME  EXERCISE PROGRAM: Access Code: [email or web address] URL: [email or web address] Date: 06/26/2024 Prepared by: Sidra Simpers  Exercises - Standing Hip Abduction with Counter Support  - 1 x daily - 3-4 x weekly - 3 sets - 10 reps - Standing Hip Extension with Counter Support  - 1 x daily - 3-4 x weekly - 3 sets - 10 reps - Standing Knee Flexion with Counter Support  - 1 x daily - 7 x weekly - 3 sets - 10 reps - Standing March with Counter Support  - 1 x daily - 3-4 x weekly - 3 sets - 10 reps - Heel Toe Raises with Unilateral Counter Support  - 1 x daily - 7 x weekly - 3 sets - 10 reps  GOALS: Goals reviewed with patient? Yes  SHORT TERM GOALS: Target date: 07/24/2024  Pt will be independent with HEP in order to demonstrate increased ability to perform tasks related to occupation/hobbies. Baseline: Pt given HEP today and encouraged to perform with reps and sets on the page. Goal status: INITIAL  LONG TERM GOALS: Target date: 09/18/2024  1.  Patient (> 66 years old) will complete five times sit to stand test in < 15 seconds indicating an increased LE strength and improved balance. Baseline: 17.40 sec Goal status: INITIAL  2.  Pt will improve DGI by at least 3 points in order to demonstrate clinically significant improvement in balance and decreased risk for falls Baseline: 13/24 Goal status: INITIAL   3.  Pt will improve ABC by at least 13% in order to demonstrate clinically significant improvement in balance confidence. Baseline: 76.25% (pt's daughter states this number is elevated) Goal status: INITIAL   4.  Patient will reduce timed up and go to <11 seconds to reduce fall risk and demonstrate improved transfer/gait  ability. Baseline: 16.59 sec Goal status: INITIAL  5.  Patient will increase 10 meter walk test to >1.57m/s as to improve gait speed for better community ambulation and to reduce fall risk. Baseline: 16.95 Goal status: INITIAL  6.  Patient will increase six minute walk test distance to >1000 for progression to community ambulator and improve gait ability Baseline: 563' Goal status: INITIAL   ASSESSMENT:  CLINICAL IMPRESSION:  Pt responded well to the exercises and was able to continue with balance assessment.  Pt seems to have the most difficulty with anything that involves quicker ambulation.  Pt is steady with his attempts, but just performs things very slow.  Pt would continue to improve in his overall speed of performance with tasks and be challenged by quicker turns and explosive exercises.  Pt ultimately feels steady, and reports that he could do things faster if necessary, but is unable to functionally demonstrate this during testing.   Pt will continue to benefit from skilled therapy to address remaining deficits in order to improve overall QoL and return to PLOF.       OBJECTIVE IMPAIRMENTS: Abnormal gait, decreased activity tolerance, decreased balance, decreased cognition, decreased coordination, decreased endurance, decreased knowledge of condition, decreased mobility, difficulty walking, decreased strength, and obesity.   ACTIVITY LIMITATIONS: carrying, lifting, bending, squatting, stairs, and locomotion level  PARTICIPATION LIMITATIONS: cleaning, laundry, driving, shopping, community activity, and occupation  PERSONAL FACTORS: Age, Fitness, Past/current experiences, Profession, Time since onset of injury/illness/exacerbation, and 3+ comorbidities: Hx of cancer, DM II, HTN, sleep apnea are also affecting patient's functional outcome.   REHAB POTENTIAL: Good  CLINICAL DECISION MAKING: Evolving/moderate complexity  EVALUATION COMPLEXITY: Moderate  PLAN:  PT FREQUENCY:  1-2x/week  PT  DURATION: 12 weeks  PLANNED INTERVENTIONS: 97750- Physical Performance Testing, 97110-Therapeutic exercises, 97530- Therapeutic activity, [id number]- Neuromuscular re-education, 97535- Self Care, [id number]- Manual therapy, [phone]- Gait training, [phone]- Canalith repositioning, Patient/Family education, Balance training, Stair training, Vestibular training, Visual/preceptual remediation/compensation, Cryotherapy, and Moist heat  PLAN FOR NEXT SESSION:   Focus on improving speed/endurance levels with tasks.  Pt very slow with gait, but is steady.    Fonda Simpers, PT, DPT Physical Therapist - [facility]  07/02/24, 3:56 PM

## 2024-07-09 ENCOUNTER — Ambulatory Visit: Admitting: Speech Pathology

## 2024-07-09 ENCOUNTER — Ambulatory Visit: Admitting: Physical Therapy

## 2024-07-09 ENCOUNTER — Ambulatory Visit

## 2024-07-09 DIAGNOSIS — R41841 Cognitive communication deficit: Secondary | ICD-10-CM

## 2024-07-09 DIAGNOSIS — M6281 Muscle weakness (generalized): Secondary | ICD-10-CM

## 2024-07-09 DIAGNOSIS — R2689 Other abnormalities of gait and mobility: Secondary | ICD-10-CM

## 2024-07-09 DIAGNOSIS — R278 Other lack of coordination: Secondary | ICD-10-CM

## 2024-07-09 NOTE — Therapy (Signed)
 OUTPATIENT OCCUPATIONAL THERAPY NEURO TREATMENT NOTE  Patient Name: Sergio Kennedy MRN: [id number] DOB:[birth year], 68 y.o., male Today's Date: 07/09/2024  PCP: Epifanio Alm SQUIBB,  MD REFERRING PROVIDER: Zettie Rosaria HERO, MD  END OF SESSION:  OT End of Session - 07/09/24 1100     Visit Number 3    Number of Visits 24    Date for Recertification  09/18/24    OT Start Time 1015    OT Stop Time 1100    OT Time Calculation (min) 45 min    Activity Tolerance Patient tolerated treatment well    Behavior During Therapy WFL for tasks assessed/performed         Past Medical History:  Diagnosis Date   Cancer (HCC)    Class 1 obesity due to excess calories with serious comorbidity and body mass index (BMI) of 34.0 to 34.9 in adult    Diabetes mellitus without complication (HCC)    History of kidney stones    HTN (hypertension), benign    Hyperlipidemia    Hypertension    Hypothyroidism    Hypothyroidism, unspecified    Irregular heart rhythm    Malignant melanoma, unspecified site [facility])    Pre-diabetes    Sleep apnea    Thyroid disease    Past Surgical History:  Procedure Laterality Date   BACK SURGERY  01/2004   COLONOSCOPY N/A 02/15/2024   Procedure: COLONOSCOPY;  Surgeon: Onita Elspeth Sharper, DO;  Location: [facility] ENDOSCOPY;  Service: Gastroenterology;  Laterality: N/A;   EXCISION MASS NECK N/A 04/30/2024   Procedure: EXCISION, MASS, NECK;  Surgeon: Rodolph Romano, MD;  Location: [facility] ORS;  Service: General;  Laterality: N/A;   FOOT SURGERY     melanoma and lymph nodes removed  2023   on back side of the neck   POLYPECTOMY  02/15/2024   Procedure: POLYPECTOMY, INTESTINE;  Surgeon: Onita Elspeth Sharper, DO;  Location: [facility] ENDOSCOPY;  Service: Gastroenterology;;   Patient Active Problem List   Diagnosis Date Noted   Obstructive sleep apnea 05/23/2018   Chronic atrial fibrillation (HCC) 05/23/2018   ONSET DATE: 06/16/24  REFERRING DIAG: Brain  Lesion  THERAPY DIAG:  Muscle Weakness Other Lack of coordination  Rationale for Evaluation and Treatment: Rehabilitation  SUBJECTIVE:   SUBJECTIVE STATEMENT: Pt reports having an appointment at [facility] after therapy today. Pt accompanied by: Daughter, Burnard  PERTINENT HISTORY: Pt. was diagnosed with a Large Frontal Intraventricular Mass. Pt. Was diagnosed with Melonoma  with Intraventricular Metastasis. MRI Imaging revealed a 3.5cm enhancing lesion in the Right Frontal Horn without Hydrocephalus but abutting the Foramen of [place], and fornices. Pt. was admitted to the hospital from 10/6-10/10/25 for a right craniotomy for resection.  PRECAUTIONS: No bending , twisting, lifting over 10#  until cleared by neurosurgery.  WEIGHT BEARING RESTRICTIONS: No  PAIN:  Are you having pain? No  FALLS: Has patient fallen in last 6 months? No  LIVING ENVIRONMENT: Lives with: lives alone  Lives in: House/apartment, 2 levels, lives on the 1st floor. Stairs: 1 stoop. Has following equipment at home: shower chair  PLOF: Independent  PATIENT GOALS: To get back to his old self.  OBJECTIVE:  Note: Objective measures were completed at Evaluation unless otherwise noted.  HAND DOMINANCE: Right  ADLs: Overall ADLs: Cues for step-by-step direction for each ADL task Transfers/ambulation related to ADLs: Eating:  Independent Grooming: Independent standing at the sink to complete UB Dressing: MinA LB Dressing: MinA; Assist selecting clothing in the morning Toileting: Independently Bathing:  Independent Tub Shower transfers: Supervision  IADLs: Shopping: Assist and cuing Light housekeeping: Daughter Meal Prep: Able to get a light snack from the fridge Community mobility: Relies on family and friends Medication management: Per daughter-Pt. Is able to identify Meds, and when they need to be taken. Financial management:  Daughter assists Handwriting: 50% legible Work History: Retired from  designer, fashion/clothing; currently working at [employer] Hobbies: Therapist, Music  MOBILITY STATUS:  TBD  ACTIVITY TOLERANCE: Activity tolerance:  Fair  FUNCTIONAL OUTCOME MEASURES: TBD  UPPER EXTREMITY ROM:    Active ROM Right Eval WFL Left Eval [facility]  Shoulder flexion    Shoulder abduction    Shoulder adduction    Shoulder extension    Shoulder internal rotation    Shoulder external rotation    Elbow flexion    Elbow extension    Wrist flexion    Wrist extension    Wrist ulnar deviation    Wrist radial deviation    Wrist pronation    Wrist supination    (Blank rows = not tested)  UPPER EXTREMITY MMT:     MMT Right Eval   Left Eval  Shoulder flexion 5/5 4/5  Shoulder abduction 5/5 4/5  Shoulder adduction    Shoulder extension    Shoulder internal rotation    Shoulder external rotation    Middle trapezius    Lower trapezius    Elbow flexion 5/5 4/5  Elbow extension 5/5 4/5  Wrist flexion 5/5 4/5  Wrist extension 5/5 4/5  Wrist ulnar deviation    Wrist radial deviation    Wrist pronation 5/5 4/5  Wrist supination 5/5 4/5  (Blank rows = not tested)  HAND FUNCTION:  Eval: Grip strength: Right: 70 lbs; Left: 46 lbs, Lateral pinch: Right: 21 lbs, Left: 17 lbs, and 3 point pinch: Right: 14 lbs, Left: 11 lbs  COORDINATION: 9 Hole Peg test: Right: 57 sec; Left: 1 min. &10 sec  SENSATION: WFL  EDEMA: Intact  COGNITION: Overall cognitive status: Impaired  decreased insight into impairments. Per daughter report: limited task initiation for more, and sequencing morning care at home  VISION: Subjective report: Per Pt. report: No changes, per chart  Left visual spatial inattention  VISION ASSESSMENT:  07/02/24 Ocular ROM: [facility] Tracking/Visual pursuits: Decreased smoothness of eye movement to Left superior field and Decreased smoothness of eye movement to Left inferior field Saccades: additional eye shifts occurred during testing,  undershoots, and decreased speed of saccadic movements Visual Fields: no apparent deficits  PERCEPTION: Pt presents with mild L sided inattention  PRAXIS: TBD                                                                                                                           TREATMENT DATE:07/09/24 Therapeutic Exercise: -L grip strengthening: Hand gripper set at 23.4# for 1 trial, and reduced to 17.9# for 2 more trials to remove jumbo pegs from pegboard.  Min vc to increase attention to release  time to avoid dropped pegs before discarding into container.  Container was placed to pt's L side to further promote L sided attention. -Issued green theraband and instructed in B/LUE strengthening exercises: Pt completed bilat horiz abd, L shoulder flexion and abd, bilat ER with elbows at sides, and L elbow flex and ext x2 sets 10-12 reps each.  Mod vc for form/technique.  Self Care: -HEP review: Handout issued and reviewed with pt and daughter for above noted theraband exercises.  OT added written cues to handout as needed to increase understanding of visuals.   -Encouraged supv with exercises to ensure appropriate initiation and termination of exercise reps (pt required cues for termination after it was observed that pt was not counting reps and kept going) -Education provided on prioritization of HEP, encouraging daughter to have pt use putty over theraband if their busy schedule does not allow for both, as decreased grip strength appears to be more of an impact to pt's functioning over proximal strength at this time, and OT will continue to provide proximal strengthening opportunities during OT visits.  Daughter verbalized understanding of all education provided.   PATIENT EDUCATION: Education details: HEP progression Person educated: Patient and Child(ren) Education method: Explanation, Demonstration, Tactile cues, Verbal cues, and Handouts Education comprehension: verbalized understanding,  returned demonstration, verbal cues required, and needs further education  HOME EXERCISE PROGRAM: Green theraputty, green theraband for B/LUE strengthening  GOALS: Goals reviewed with patient? No  SHORT TERM GOALS: Target date: 08/07/2024  Pt. Will be independent with HEPs for LUE strength, and bilateral FMC  Baseline: Eval:  Goal status: INITIAL   LONG TERM GOALS: Target date: 09/18/2024  Pt. Will improve LUE strength by 2 mm grades to assist with ADLs, Baseline: Eval: Right: 5/5. L: 4/5 Goal status: INITIAL  2.  Pt. Will increase left grip strength by 5# to be able to securely hold items during ADLs/IADLs. Baseline: Eval: Grip strength: Right: 70 lbs; Left: 46 lbs Goal status: INITIAL  3.  Pt. Will increased Left pinch strength by 3# to be able to efficiently open bottles and containers Baseline: Eval: Lateral pinch: Right: 21 lbs, Left: 17 lbs, and 3 point pinch: Right: 14 lbs, Left: 11 lbs Goal status: INITIAL  4.  Pt. Will improve Left [facility] skills by 3 sec. Of speed to be able to efficiently manipulate small objects during ADLs, and IADLs. Baseline: Eval: 9 Hole Peg test: Right: 57 sec; Left: 1 min. &10 sec Goal status: INITIAL  5.  Pt. Will independently independently, and efficiently be able to navigate through his cellphone. Baseline: Eval: Pt. reports pt. has difficulty  Goal status: INITIAL  6.  Pt. Will independently demonstrate compensatory strategies as needed to increase Left sided visual spatial inattention Baseline: Eval: Recent history of left visual spatial inattention Goal status: INITIAL  ASSESSMENT: CLINICAL IMPRESSION: Pt with good tolerance to UE strengthening exercises noted above.  Pt demonstrates need for supv for exercise carry over at this time to ensure proper form/technique/and cues for activity initiation/termination (see above tx note).  Daughter verbalized understanding of all education today re: HEP progression.  Pt continues to require  occasional min vc for increasing attention to L side when reading visual handout and when performing LUE exercises, noting pt often initiated exercises with the RUE rather than the L.  Will continue to review as needed theraband exercises as needed.  Pt reports fairly consistent use of theraputty at home, acknowledging use of putty at least every other day.  Pt  will continue to benefit from skilled OT services to work on improving LUE functioning, improving attention to L side, and to provide education on compensatory strategies as needed in order to increase engagement in, and maximize independence with ADL/IADL tasks.   PERFORMANCE DEFICITS: in functional skills including ADLs, IADLs, coordination, dexterity, proprioception, ROM, strength, pain, Fine motor control, Gross motor control, decreased knowledge of precautions, decreased knowledge of use of DME, vision, and UE functional use, cognitive skills including attention, problem solving, and sequencing, and psychosocial skills including coping strategies, environmental adaptation, interpersonal interactions, and routines and behaviors.   IMPAIRMENTS: are limiting patient from ADLs, IADLs, and leisure.   CO-MORBIDITIES: may have co-morbidities  that affects occupational performance. Patient will benefit from skilled OT to address above impairments and improve overall function.  MODIFICATION OR ASSISTANCE TO COMPLETE EVALUATION: Min-Moderate modification of tasks or assist with assess necessary to complete an evaluation.  OT OCCUPATIONAL PROFILE AND HISTORY: Detailed assessment: Review of records and additional review of physical, cognitive, psychosocial history related to current functional performance.  CLINICAL DECISION MAKING: Moderate - several treatment options, min-mod task modification necessary  REHAB POTENTIAL: Good  EVALUATION COMPLEXITY: Moderate    PLAN:  OT FREQUENCY: 1-2x/week  OT DURATION: 12 weeks  PLANNED INTERVENTIONS:  97168 OT Re-evaluation, 97535 self care/ADL training, 02889 therapeutic exercise, 97530 therapeutic activity, 97112 neuromuscular re-education, 97140 manual therapy, 97010 moist heat, energy conservation, patient/family education, and DME and/or AE instructions  RECOMMENDED OTHER SERVICES: ST referral requested on 10/16, PT  CONSULTED AND AGREED WITH PLAN OF CARE: Patient  PLAN FOR NEXT SESSION: see above  Inocente Blazing, MS, OTR/L  07/09/2024, 11:01 AM

## 2024-07-09 NOTE — Therapy (Signed)
 OUTPATIENT PHYSICAL THERAPY NEURO TREATMENT   Patient Name: Sergio Kennedy MRN: [id number] DOB:[birth year], 68 y.o., male Today's Date: 07/09/2024   PCP: Epifanio Alm SQUIBB, MD  REFERRING PROVIDER: Zettie Rosaria HERO, MD   END OF SESSION:   PT End of Session - 07/09/24 0934     Visit Number 3    Number of Visits 25    Date for Recertification  09/18/24    PT Start Time 0934    PT Stop Time 1014    PT Time Calculation (min) 40 min    Equipment Utilized During Treatment Gait belt    Activity Tolerance Patient tolerated treatment well    Behavior During Therapy WFL for tasks assessed/performed            Past Medical History:  Diagnosis Date   Cancer (HCC)    Class 1 obesity due to excess calories with serious comorbidity and body mass index (BMI) of 34.0 to 34.9 in adult    Diabetes mellitus without complication (HCC)    History of kidney stones    HTN (hypertension), benign    Hyperlipidemia    Hypertension    Hypothyroidism    Hypothyroidism, unspecified    Irregular heart rhythm    Malignant melanoma, unspecified site [facility])    Pre-diabetes    Sleep apnea    Thyroid disease    Past Surgical History:  Procedure Laterality Date   BACK SURGERY  01/2004   COLONOSCOPY N/A 02/15/2024   Procedure: COLONOSCOPY;  Surgeon: Onita Elspeth Sharper, DO;  Location: [facility] ENDOSCOPY;  Service: Gastroenterology;  Laterality: N/A;   EXCISION MASS NECK N/A 04/30/2024   Procedure: EXCISION, MASS, NECK;  Surgeon: Rodolph Romano, MD;  Location: [facility] ORS;  Service: General;  Laterality: N/A;   FOOT SURGERY     melanoma and lymph nodes removed  2023   on back side of the neck   POLYPECTOMY  02/15/2024   Procedure: POLYPECTOMY, INTESTINE;  Surgeon: Onita Elspeth Sharper, DO;  Location: [facility] ENDOSCOPY;  Service: Gastroenterology;;   Patient Active Problem List   Diagnosis Date Noted   Obstructive sleep apnea 05/23/2018   Chronic atrial fibrillation (HCC) 05/23/2018     ONSET DATE: 06/16/24  REFERRING DIAG:  G93.9 (ICD-10-CM) - Disorder of brain, unspecified  C43.9 (ICD-10-CM) - Metastatic melanoma (HCC)    THERAPY DIAG:  Imbalance  Muscle weakness (generalized)  Other abnormalities of gait and mobility  Other lack of coordination  Rationale for Evaluation and Treatment: Rehabilitation  SUBJECTIVE:  SUBJECTIVE STATEMENT: Pt reports he is feeling fine today. Pt reporting that the coldness in his legs is feeling better, but still experiencing some symptoms.   Pt reporting that he had MRI yesterday in [place].    Per EVAL: Pt is accompanied by daughter who assists with history and functional level of mobilization.  Pt had a tumor removed on 06/16/24.  Pt reports that he has been able to walk, but it's been more slow and steady throughout the process.  Pt denies any weakness in the LE's. Pt enjoys fishing/hiking and would like to return to doing those activities.  Pt was also working at [employer] part time and would prefer to return to that occupation as well  Pt accompanied by: family member, daughter [name]  PERTINENT HISTORY: Pt has hx of cancer and tumor removal via craniectomy on 06/16/24.  Pt does have DM, HTN, sleep apnea, and irregular heat rhythm.  PAIN:  Are you having pain? No  PRECAUTIONS: Other: No bending over, no heavy lifting (>10#), and reduce twisting in the neck.  RED FLAGS: None   WEIGHT BEARING RESTRICTIONS: No  FALLS: Has patient fallen in last 6 months? No  LIVING ENVIRONMENT: Lives with: lives with their daughter until pt is recovered. Lives in: House/apartment Stairs: One step entry Has following equipment at home: Walking stick.  PLOF: Independent  PATIENT GOALS: Be able to improve strength back to baseline levels.     OBJECTIVE: Note: Objective measures were completed at Evaluation unless otherwise noted.  DIAGNOSTIC FINDINGS:   EXAM: CT CERVICAL SPINE WITHOUT CONTRAST  IMPRESSION: No recent fracture is seen in cervical spine. 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  From radiology note 07/08/24: EXAM: Magnetic resonance imaging, brain, without and with contrast material. 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 contrast.  FINDINGS: Postsurgical changes right frontoparietal craniotomy for mass resection. There is hemorrhage in the resection cavity, in the right basal ganglia, and extending into the right lateral ventricle, overall decreased since prior. There are a few nodular foci of enhancement along the medial margin of the resection cavity (16:122, 16:119, 16:116), more conspicuous since prior. Similar vasogenic edema in the right frontal lobe. Increased T2/FLAIR hyperintense signal in the right basal ganglia (5:16), likely vasogenic edema. Small amount of hemorrhage layering in the left occipital horn, decreased since prior. There is overall decreased blood products throughout the remaining ventricles. There is also hemosiderin deposition along the left basilar cistern. Decreased size of evolving bilateral subdural collections, right greater than left.  Ex-vacuo dilation of the ventricles, similar to prior. Decreased pneumocephalus. There is no midline shift.  Unchanged 0.7 cm cystic focus in the right sella (14:90). Consider correlation with clinical findings/hormonal analysis. Right cerebellar DVA.  IMPRESSION:  Status post right frontoparietal craniotomy for mass resection with new areas of nodularity  along the medial margin suspicious for recurrent/residual disease-attention on follow-up.  Unchanged cystic pituitary lesion.     COGNITION: Overall cognitive status: Within functional limits for tasks assessed   SENSATION: WFL  COORDINATION: Pt with good coordination in the LE's, reduced quickness with rapid hand turns, and reduced quickness with fingertip touches.  POSTURE: rounded shoulders and forward head  LOWER EXTREMITY MMT:    MMT Right Eval Left Eval  Hip flexion 5 5  Hip extension    Hip abduction    Hip adduction    Hip internal rotation    Hip external rotation    Knee flexion 5 4+  Knee extension 5 4+  Ankle dorsiflexion 5 4  Ankle plantarflexion    Ankle inversion    Ankle eversion    (Blank rows = not tested)  TRANSFERS: Sit to stand: Complete Independence  Assistive device utilized: None     Stand to sit: Complete Independence  Assistive device utilized: None     Chair to chair: Complete Independence  Assistive device utilized: None       RAMP:  Not tested  CURB:  Not tested  STAIRS: Findings: Level of Assistance: Complete Independence, Stair Negotiation Technique: Alternating Pattern  with Bilateral Rails, Number of Stairs: 4, Height of Stairs: 6   , and Comments: pt steady with ambulation, but slowed similar to walking style. GAIT: Findings: Distance walked: 63' and Comments: Pt with slowed gait pattern and reduce trunk rotation.  Pt has little to no arm swing as well and looks unsure/unsteady when ambulating.  FUNCTIONAL TESTS:  5 times sit to stand: 17.40 sec Timed up and go (TUG): 16.59 sec 6 minute walk test: TBD at next visit 10 meter walk test: 16.95 sec  Dynamic Gait Index: Dynamic Gait Index  Mark the lowest level that applies.   Date Performed 06/26/24  Gait level surface (2) Mild Impairment: Walks 20', uses AD, slower speed, mild gait deviations  2. Change in gait speed (1) Moderate Impairment: Makes only minor adjustments to  walking speed, or accomplishes a change in speed with significant gait deviations, or changes speed but has significant gait deviations, or changes speed but loses balance but is able to recover and continue walking  3. Gait with horizontal head turns (1) Moderate Impairment: Performs head turns with moderate change in gait velocity, slows down, staggers but recovers, can continue to walk  4. Gait with vertical head turns (1) Moderate Impairment: Performs head turns with moderate change in gait velocity, slows down, staggers but recovers, can continue to walk  5. Gait and pivot turn (2) Mild Impairment: Pivot turns safely in > 3 seconds and stops with no loss of balance  6. Step over obstacle (2) Mild Impairment: Is able to step over box, but must slow down and adjust steps to clear box safely  7. Step around obstacle (2) Mild Impairment: Is able to step around both cones, but must slow down and adjust steps to clear cones  8. Steps (2) Mild Impairment: Alternating feet, must use rail  Total score 13/24    Score Interpretation: Score of <19 indicates high risk of falls.  Minimally Clinically Important Difference (MCID):  =DGI scores of<21/24 = 1.80 points DGI scores of >21/24 = 0.60 points   [place] T, Inbar-Borovsky N, Brozgol M, Giladi N, [place] JM. The Dynamic Gait Index in healthy older adults: the role of stair climbing, fear of falling and gender. Gait Posture. 2009 Feb;29(2):237-41. doi: 10.1016/j.gaitpost.2008.08.013. Epub 2008 Oct 8. PMID: 81154560; PMCID: EFR7290501.  Pardasaney, MYRTIS LOIS Bonus, GEANNIE POUR., et al. (2012). Sensitivity to change and responsiveness of four balance measures for community-dwelling older adults. Physical therapy 92(3): 388-397.    BERG:  Item Test date: 07/02/24   Sitting to standing 4. able to stand without using hands and stabilize independently  2. Standing unsupported 4. able to stand safely for 2 minutes  3. Sitting with back unsupported, feet  supported 4. able to sit safely and securely for 2 minutes  4. Standing to sitting 4. sits safely with minimal use of hands  5. Pivot transfer  4. able to transfer safely with minor use of hands  6. Standing unsupported with eyes closed 4. able to stand 10 seconds safely  7. Standing unsupported with feet together 4. able to place feet together independently and stand 1 minute safely  8. Reaching forward with outstretched arms while standing 4. can reach forward confidently 25 cm (10 inches)  9. Pick up object from the floor from standing 4. able to pick up slipper safely and easily  10. Turning to look behind over left and right shoulders while standing 4. looks behind from both sides and weight shifts well  11. Turn 360 degrees 2. able to turn 360 degrees safely but slowly  12. Place alternate foot on step or stool while standing unsupported 4. ble to stand independently and safely and complete 8 steps in 20 seconds  13. Standing unsupported one foot in front 3. able to place foot ahead independently and hold 30 seconds  14. Standing on one leg 2. able to lift leg independently and hold >= 3 seconds    Total Score 51/56    PATIENT SURVEYS:  ABC scale: The Activities-Specific Balance Confidence (ABC) Scale 0% 10 20 30  40 50 60 70 80 90 100% No confidence<->completely confident  "How confident are you that you will not lose your balance or become unsteady when you . . .   Date tested 06/26/24  Walk around the house 80%  2. Walk up or down stairs 80%  3. Bend over and pick up a slipper from in front of a closet floor 10%  4. Reach for a small can off a shelf at eye level 100%  5. Stand on tip toes and reach for something above your head 10%  6. Stand on a chair and reach for something 0%  7. Sweep the floor 80%  8. Walk outside the house to a car parked in the driveway 100%  9. Get into or out of a car 100%  10. Walk across a parking lot to the mall 100%  11. Walk up or down a ramp  100%  12. Walk in a crowded mall where people rapidly walk past you 100%  13. Are bumped into by people as you walk through the mall 100%  14. Step onto or off of an escalator while you are holding onto the railing 100%  15. Step onto or off an escalator while holding onto parcels such that you cannot hold onto the railing 100%  16. Walk outside on icy sidewalks 60%  Total: #/16 76.25%                                                                                                                                 TREATMENT DATE: 07/09/24  Circuit training:  Gait 5x laps (~750'), cueing for increased speed throughout; immediately followed by 10xSTS without UE support Progressed circuit to weighted carries for increased difficulty; gait >750' with 9-10 lb weighted ball; immediately followed by 10STS with 9-10lb ball Pt requiring frequent verbal cueing for increased speed, unable to maintain without cueing.  Gait >750' without weighted ball to focus on increased speed, provided external target to pt by completing gait to physical landmark in allotted time; immediately followed by 10 STS with 9-10 lb ball  Pt completing square activity with home base at center, provided cueing for increased speed by having to reach target in <3 sec, then return to base in <3 sec.     PATIENT EDUCATION: Education details: Pt educated on role of PT and services provided during current POC, along with prognosis and information about the clinic. Person educated: Patient Education method: Explanation, Demonstration, and Tactile cues Education comprehension: verbalized understanding  HOME EXERCISE PROGRAM: Access Code: [email or web address] URL: [email or web address] Date: 06/26/2024 Prepared by: Sidra Simpers  Exercises - Standing Hip Abduction with Counter Support  - 1 x daily - 3-4 x weekly - 3 sets - 10 reps - Standing Hip Extension with Counter Support  - 1 x daily - 3-4 x weekly - 3 sets - 10  reps - Standing Knee Flexion with Counter Support  - 1 x daily - 7 x weekly - 3 sets - 10 reps - Standing March with Counter Support  - 1 x daily - 3-4 x weekly - 3 sets - 10 reps - Heel Toe Raises with Unilateral Counter Support  - 1 x daily - 7 x weekly - 3 sets - 10 reps  GOALS: Goals reviewed with patient? Yes  SHORT TERM GOALS: Target date: 07/24/2024  Pt will be independent with HEP in order to demonstrate increased ability to perform tasks related to occupation/hobbies. Baseline: Pt given HEP today and encouraged to perform with reps and sets on the page. Goal status: INITIAL  LONG TERM GOALS: Target date: 09/18/2024  1.  Patient (> 42 years old) will complete five times sit to stand test in < 15 seconds indicating an increased LE strength and improved balance. Baseline: 17.40 sec Goal status: INITIAL  2.  Pt will improve DGI by at least 3 points in order to demonstrate clinically significant improvement in balance and decreased risk for falls Baseline: 13/24 Goal status: INITIAL   3.  Pt will improve ABC by at least 13% in order to demonstrate clinically significant improvement in balance confidence. Baseline: 76.25% (pt's daughter states this number is elevated) Goal status: INITIAL   4.  Patient will reduce timed up and go to <11 seconds to reduce fall risk and demonstrate improved transfer/gait ability. Baseline: 16.59 sec Goal status: INITIAL  5.  Patient will increase 10 meter walk test to >1.27m/s as to improve gait speed for better community ambulation and to reduce fall risk. Baseline: 16.95 Goal status: INITIAL  6.  Patient will increase six minute walk test distance to >1000 for progression to community ambulator and improve gait ability Baseline: 563' Goal status: INITIAL   ASSESSMENT:  CLINICAL IMPRESSION:  Pt responded well to the exercises, completing circuit training today. Focus this session for increasing gait speed during circuit training. Provided  external target for pt by having pt reach landmark in hallways in allotted time, continuing to require extensive verbal cueing throughout for maintaining increased speed. Unable to determine if need for extensive cueing for increasing speed throughout activities secondary to attention  deficit or if secondary to motor planning deficit.  Pt will continue to benefit from skilled therapy to address remaining deficits in order to improve overall QoL and return to PLOF.       OBJECTIVE IMPAIRMENTS: Abnormal gait, decreased activity tolerance, decreased balance, decreased cognition, decreased coordination, decreased endurance, decreased knowledge of condition, decreased mobility, difficulty walking, decreased strength, and obesity.   ACTIVITY LIMITATIONS: carrying, lifting, bending, squatting, stairs, and locomotion level  PARTICIPATION LIMITATIONS: cleaning, laundry, driving, shopping, community activity, and occupation  PERSONAL FACTORS: Age, Fitness, Past/current experiences, Profession, Time since onset of injury/illness/exacerbation, and 3+ comorbidities: Hx of cancer, DM II, HTN, sleep apnea are also affecting patient's functional outcome.   REHAB POTENTIAL: Good  CLINICAL DECISION MAKING: Evolving/moderate complexity  EVALUATION COMPLEXITY: Moderate  PLAN:  PT FREQUENCY: 1-2x/week  PT DURATION: 12 weeks  PLANNED INTERVENTIONS: 97750- Physical Performance Testing, 97110-Therapeutic exercises, 97530- Therapeutic activity, [id number]- Neuromuscular re-education, 97535- Self Care, [id number]- Manual therapy, [id number]- Gait training, [phone]- Canalith repositioning, Patient/Family education, Balance training, Stair training, Vestibular training, Visual/preceptual remediation/compensation, Cryotherapy, and Moist heat  PLAN FOR NEXT SESSION:   Focus on improving speed/endurance levels with tasks.  Pt very slow with gait, but is steady. Treadmill for maintaining increased speed? Add cognitive dual tasks  challenges to increase sustained attention    Chiquita Silvan, SPT Physical Therapy Student - [facility]  [facility]  07/09/24, 6:15 PM

## 2024-07-09 NOTE — Therapy (Signed)
 OUTPATIENT SPEECH LANGUAGE PATHOLOGY  COGNITION TREATMENT NOTE   Patient Name: Sergio Kennedy MRN: [id number] DOB:1955/09/22, 68 y.o., male Today's Date: 07/09/2024  PCP: Alm Needle, MD REFERRING PROVIDER: Rosaria CHRISTELLA Lush, MD   End of Session - 07/09/24 [phone]     Visit Number 2    Number of Visits 25    Date for Recertification  09/24/24    Authorization Type [employer] H[employer] PPO    Progress Note Due on Visit 10    SLP Start Time [phone]    SLP Stop Time  0930    SLP Time Calculation (min) 40 min    Activity Tolerance Patient tolerated treatment well          Past Medical History:  Diagnosis Date   Cancer (HCC)    Class 1 obesity due to excess calories with serious comorbidity and body mass index (BMI) of 34.0 to 34.9 in adult    Diabetes mellitus without complication (HCC)    History of kidney stones    HTN (hypertension), benign    Hyperlipidemia    Hypertension    Hypothyroidism    Hypothyroidism, unspecified    Irregular heart rhythm    Malignant melanoma, unspecified site [facility])    Pre-diabetes    Sleep apnea    Thyroid disease    Past Surgical History:  Procedure Laterality Date   BACK SURGERY  01/2004   COLONOSCOPY N/A 02/15/2024   Procedure: COLONOSCOPY;  Surgeon: Onita Elspeth Sharper, DO;  Location: [facility] ENDOSCOPY;  Service: Gastroenterology;  Laterality: N/A;   EXCISION MASS NECK N/A 04/30/2024   Procedure: EXCISION, MASS, NECK;  Surgeon: Rodolph Romano, MD;  Location: [facility] ORS;  Service: General;  Laterality: N/A;   FOOT SURGERY     melanoma and lymph nodes removed  2023   on back side of the neck   POLYPECTOMY  02/15/2024   Procedure: POLYPECTOMY, INTESTINE;  Surgeon: Onita Elspeth Sharper, DO;  Location: [facility] ENDOSCOPY;  Service: Gastroenterology;;   Patient Active Problem List   Diagnosis Date Noted   Obstructive sleep apnea 05/23/2018   Chronic atrial fibrillation (HCC) 05/23/2018    ONSET DATE: 06/16/2024   REFERRING  DIAG:  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 Pt had right craniotomy for tumor resection on 06/16/2024.   Medical Tests / Procedures Comments: MRI 10/6: Postoperative changes of right frontoparietal craniotomy for mass resection with postoperative blood products within the resection cavity and surgical tract.    Layering blood products in the supratentorial and infratentorial ventricular system without hydrocephalus.    Mild linear and nodular enhancement along the cavity margins, nonspecific, could reflect postsurgical inflammatory changes. Attention on follow-up.    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  Pt with Cognitive Linguistic Evaluation while admitted at [facility] on 06/17/2024. Per chart, pt tested [facility] across cognitive-linguistic domains assessed during testing.  MRI 07/08/2024 Postsurgical changes right frontoparietal craniotomy for mass resection. There is hemorrhage in the resection cavity, in the right basal ganglia, and extending into the right lateral ventricle, overall decreased since prior. There are a few nodular foci of enhancement along the medial margin of the resection cavity (16:122, 16:119, 16:116), more  conspicuous since prior. Similar vasogenic edema in the right frontal lobe. Increased T2/FLAIR hyperintense signal in the right basal ganglia (5:16), likely vasogenic edema. Small amount of hemorrhage layering in the left occipital horn, decreased since prior. There is overall decreased blood products throughout the remaining ventricles. There is also hemosiderin deposition along the left basilar  cistern. Decreased size of evolving bilateral subdural collections, right greater than left.   PAIN:  Are you having pain? No   FALLS: Has patient fallen in last 6 months?  See PT evaluation for details  LIVING ENVIRONMENT: Lives with: lives with their daughter Lives in: House/apartment  PLOF:  Level of assistance: Independent with ADLs, Independent with IADLs Employment: Full-time employment   PATIENT GOALS   to improve cognitive function for return to independent living, driving and working  SUBJECTIVE STATEMENT: Pt pleasant, when arriving to ST office, he placed his jacket on this writer's chair and began to sit in the office chair vs guest chair on other side of the table - pt was not aware of rounding the desk to staff side of office Pt accompanied by: self, daughter arrived during session  OBJECTIVE:   TODAY'S TREATMENT:  Skilled treatment session targeted pt's cognitive communication goals. SLP facilitated session by providing the following interventions:  When pt arrived to session alone. Receptionist reached out to his daughter, asking her to join our session for education on improved carryover of skills.   Pt was able to recall 7 out of 10 medicines/supplements independently as well as recall 10 out of 100 functions of the medications.   He was accurate with his recall of information regarding oncology POC.    STANDARDIZED ASSESSMENTS: TRAIL MAKING TEST (TMT) Parts A & B Both parts of the Trail Making Test consist of 25 circles distributed over a sheet of paper. In Part A, the circles are numbered 1 - 25, and the patient should draw lines to connect the numbers in ascending order. In Part B, the circles include both numbers (1 - 13) and letters (A - L); as in Part A, the patient draws lines to connect the circles in an ascending pattern, but with the added task of alternating between the numbers and letters (i.e., 1-A-2-B-3-C, etc.).   Results for both TMT A and B  are reported as the number of seconds required to complete the task; therefore, higher scores reveal greater impairment.  Results:  Trail A - DEFICIENT - 189 seconds (n=29 seconds; Deficient > 78 seconds) Trail B - DEFICIENT - 409 seconds (n= 75 seconds; Deficient > 273 seconds)  TMTs have been shown to be significantly correlated with impaired driving on road tests by older drivers. TMT-A provided the best utility for determining a range of scores (68-90 sec) for which additional road testing would be indicated in general practice settings.(Papandonatos GD, Layla CAVES, [address] [place] PP, [place] DB. Clinical Utility of the Trail-Making Test as a Predictor of Driving Performance in Older Adults. J Am Geriatr Soc. 2015 Nov;63(11):2358-64. doi: 10.1111/jgs.13776. Epub 2015 Oct 27. PMID: 73496376; PMCID: [id number].)  Education provided on results of this assessment, recommendation to wait on improvement and release from treatment team before resuming driving.   SLP further facilitated session by providing moderate A for completion of monthly calendar organization task. Pt benefited from assistant with left scanning, and all aspects of executive functioning. Education provided on simple ways to facilitate executive function within ADLs.   Pt's dysphonia appears resolved.    PATIENT REPORTED OUTCOME MEASURES (PROM):  To be completed over the next 3 sessions    PATIENT EDUCATION: Education details: cognitive communication, personality and left inattention/visual field deficits with site of lesion Person educated: Patient and Child(ren) Education method: Medical Illustrator Education comprehension: verbalized understanding and needs further education   HOME EXERCISE PROGRAM:  Recommend pt's daughter position herself to pt's left when walking out in community   GOALS:  Goals reviewed with patient? Yes  SHORT TERM GOALS: Target date: 10 sessions  With Moderate A to utilize finger  scanning technique to attend to the left most side of the page, patient will read page-level information from a novel with 50% accuracy.  Baseline: Goal status: INITIAL  2.  With moderate A, pt will complete complex medication management task with 75% accuracy.  Baseline:  Goal status: INITIAL  3.  With moderate A, pt will complex money management task with 75& accuracy.  Baseline:  Goal status: INITIAL  4.  With moderate A, pt will demonstrate emergent awareness by self-monitoring and self-correcting errors in 90% of opportunities.  Baseline:  Goal status: INITIAL   LONG TERM GOALS: Target date: 09/24/2024  With Supervision A, patient will demonstrate ability to complete complex reasoning tasks with 75% accuracy.  Baseline:  Goal status: INITIAL  2.  With Min A, pt will demonstrate anticipatory awareness by providing solution for hypothetical safety situation in 5 out of 7 opportunities.  Baseline:  Goal status: INITIAL  3.  Pt will report improved cognitive communication via PROM by 5 points at last ST session   Baseline:  Goal status: INITIAL   ASSESSMENT:  CLINICAL IMPRESSION: Patient is a 68 y.o. male  who was seen today for a cognitive communication treatment session d/t recent frontoparietal craniotomy for mass resection. He presents with moderate cognitive impairment c/b severe deficits in memory (short term, working memory, prospective memory), attention (selective and left sided), higher level problem solving, reasoning, executive functions and emergent awareness.   Pt presents with a moderate right hemisphere disorder. See the above treatment note for details.   OBJECTIVE IMPAIRMENTS include attention, memory, awareness, and executive functioning. These impairments are limiting patient from return to work, managing medications, managing appointments, managing finances, household responsibilities, ADLs/IADLs, and effectively communicating at home and in  community. Factors affecting potential to achieve goals and functional outcome are medical prognosis, severity of impairments, and future radiation and infusions. Patient will benefit from skilled SLP services to address above impairments and improve overall function.  REHAB POTENTIAL: Good  PLAN: SLP FREQUENCY: 1-2x/week  SLP DURATION: 12 weeks  PLANNED INTERVENTIONS: Environmental controls, Cueing hierachy, Cognitive reorganization, Internal/external aids, Functional tasks, SLP instruction and feedback, Compensatory strategies, and Patient/family education    Sharronda Schweers B. Rubbie, M.S., CCC-SLP, Tree Surgeon Certified Brain Injury Specialist [facility]  [facility] Office [phone] Ascom [phone] Fax [phone]

## 2024-07-10 NOTE — Therapy (Unsigned)
 OUTPATIENT PHYSICAL THERAPY NEURO TREATMENT   Patient Name: Sergio Kennedy MRN: [id number] DOB:[birth year], 68 y.o., male Today's Date: 07/11/2024   PCP: Epifanio Alm SQUIBB, MD  REFERRING PROVIDER: Zettie Rosaria HERO, MD   END OF SESSION:   PT End of Session - 07/11/24 1004     Visit Number 4    Number of Visits 25    Date for Recertification  09/18/24    PT Start Time 0845    PT Stop Time 0928    PT Time Calculation (min) 43 min    Activity Tolerance Patient tolerated treatment well          Past Medical History:  Diagnosis Date   Cancer (HCC)    Class 1 obesity due to excess calories with serious comorbidity and body mass index (BMI) of 34.0 to 34.9 in adult    Diabetes mellitus without complication (HCC)    History of kidney stones    HTN (hypertension), benign    Hyperlipidemia    Hypertension    Hypothyroidism    Hypothyroidism, unspecified    Irregular heart rhythm    Malignant melanoma, unspecified site [facility])    Pre-diabetes    Sleep apnea    Thyroid disease    Past Surgical History:  Procedure Laterality Date   BACK SURGERY  01/2004   COLONOSCOPY N/A 02/15/2024   Procedure: COLONOSCOPY;  Surgeon: Onita Elspeth Sharper, DO;  Location: [facility] ENDOSCOPY;  Service: Gastroenterology;  Laterality: N/A;   EXCISION MASS NECK N/A 04/30/2024   Procedure: EXCISION, MASS, NECK;  Surgeon: Rodolph Romano, MD;  Location: [facility] ORS;  Service: General;  Laterality: N/A;   FOOT SURGERY     melanoma and lymph nodes removed  2023   on back side of the neck   POLYPECTOMY  02/15/2024   Procedure: POLYPECTOMY, INTESTINE;  Surgeon: Onita Elspeth Sharper, DO;  Location: [facility] ENDOSCOPY;  Service: Gastroenterology;;   Patient Active Problem List   Diagnosis Date Noted   Obstructive sleep apnea 05/23/2018   Chronic atrial fibrillation (HCC) 05/23/2018    ONSET DATE: 06/16/24  REFERRING DIAG:  G93.9 (ICD-10-CM) - Disorder of brain, unspecified  C43.9 (ICD-10-CM) -  Metastatic melanoma (HCC)    THERAPY DIAG:  Muscle weakness (generalized)  Other lack of coordination  Imbalance  Rationale for Evaluation and Treatment: Rehabilitation  SUBJECTIVE:                                                                                                                                                                                             SUBJECTIVE STATEMENT:  07/11/2024 Pt reports feeling tired as expected after  last session. Pt is feeling fine today and was accompanied by daughter who asked about shoe recommendations.    Per EVAL: Pt is accompanied by daughter who assists with history and functional level of mobilization.  Pt had a tumor removed on 06/16/24.  Pt reports that he has been able to walk, but it's been more slow and steady throughout the process.  Pt denies any weakness in the LE's. Pt enjoys fishing/hiking and would like to return to doing those activities.  Pt was also working at [employer] part time and would prefer to return to that occupation as well  Pt accompanied by: family member, daughter [name]  PERTINENT HISTORY: Pt has hx of cancer and tumor removal via craniectomy on 06/16/24.  Pt does have DM, HTN, sleep apnea, and irregular heat rhythm.  PAIN:  Are you having pain? No  PRECAUTIONS: Other: No bending over, no heavy lifting (>10#), and reduce twisting in the neck.  RED FLAGS: None   WEIGHT BEARING RESTRICTIONS: No  FALLS: Has patient fallen in last 6 months? No  LIVING ENVIRONMENT: Lives with: lives with their daughter until pt is recovered. Lives in: House/apartment Stairs: One step entry Has following equipment at home: Walking stick.  PLOF: Independent  PATIENT GOALS: Be able to improve strength back to baseline levels.    OBJECTIVE: Note: Objective measures were completed at Evaluation unless otherwise noted.  DIAGNOSTIC FINDINGS:   EXAM: CT CERVICAL SPINE WITHOUT CONTRAST  IMPRESSION: No recent  fracture is seen in cervical spine. 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  From radiology note 07/08/24: EXAM: Magnetic resonance imaging, brain, without and with contrast material. 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 contrast.  FINDINGS: Postsurgical changes right frontoparietal craniotomy for mass resection. There is hemorrhage in the resection cavity, in the right basal ganglia, and extending into the right lateral ventricle, overall decreased since prior. There are a few nodular foci of enhancement along the medial margin of the resection cavity (16:122, 16:119, 16:116), more conspicuous since prior. Similar vasogenic edema in the right frontal lobe. Increased T2/FLAIR hyperintense signal in the right basal ganglia (5:16), likely vasogenic edema. Small amount of hemorrhage layering in the left occipital horn, decreased since prior. There is overall decreased blood products throughout the remaining ventricles. There is also hemosiderin deposition along the left basilar cistern. Decreased size of evolving bilateral subdural collections, right greater than left.  Ex-vacuo dilation of the ventricles, similar to prior. Decreased pneumocephalus. There is no midline shift.  Unchanged 0.7 cm cystic focus in the right sella (14:90). Consider correlation with clinical findings/hormonal analysis. Right cerebellar DVA.  IMPRESSION:  Status post right frontoparietal craniotomy for mass resection with new areas of nodularity along the medial margin suspicious for recurrent/residual disease-attention on follow-up.  Unchanged cystic pituitary lesion.     COGNITION: Overall cognitive status: Within functional  limits for tasks assessed   SENSATION: WFL  COORDINATION: Pt with good coordination in the LE's, reduced quickness with rapid hand turns, and reduced quickness with fingertip touches.  POSTURE: rounded shoulders and forward head  LOWER EXTREMITY MMT:    MMT Right Eval Left Eval  Hip flexion 5 5  Hip extension    Hip abduction    Hip adduction  Hip internal rotation    Hip external rotation    Knee flexion 5 4+  Knee extension 5 4+  Ankle dorsiflexion 5 4  Ankle plantarflexion    Ankle inversion    Ankle eversion    (Blank rows = not tested)  TRANSFERS: Sit to stand: Complete Independence  Assistive device utilized: None     Stand to sit: Complete Independence  Assistive device utilized: None     Chair to chair: Complete Independence  Assistive device utilized: None       RAMP:  Not tested  CURB:  Not tested  STAIRS: Findings: Level of Assistance: Complete Independence, Stair Negotiation Technique: Alternating Pattern  with Bilateral Rails, Number of Stairs: 4, Height of Stairs: 6   , and Comments: pt steady with ambulation, but slowed similar to walking style. GAIT: Findings: Distance walked: 31' and Comments: Pt with slowed gait pattern and reduce trunk rotation.  Pt has little to no arm swing as well and looks unsure/unsteady when ambulating.  FUNCTIONAL TESTS:  5 times sit to stand: 17.40 sec Timed up and go (TUG): 16.59 sec 6 minute walk test: TBD at next visit 10 meter walk test: 16.95 sec  Dynamic Gait Index: Dynamic Gait Index  Mark the lowest level that applies.   Date Performed 06/26/24  Gait level surface (2) Mild Impairment: Walks 20', uses AD, slower speed, mild gait deviations  2. Change in gait speed (1) Moderate Impairment: Makes only minor adjustments to walking speed, or accomplishes a change in speed with significant gait deviations, or changes speed but has significant gait deviations, or changes speed but loses balance but is able to  recover and continue walking  3. Gait with horizontal head turns (1) Moderate Impairment: Performs head turns with moderate change in gait velocity, slows down, staggers but recovers, can continue to walk  4. Gait with vertical head turns (1) Moderate Impairment: Performs head turns with moderate change in gait velocity, slows down, staggers but recovers, can continue to walk  5. Gait and pivot turn (2) Mild Impairment: Pivot turns safely in > 3 seconds and stops with no loss of balance  6. Step over obstacle (2) Mild Impairment: Is able to step over box, but must slow down and adjust steps to clear box safely  7. Step around obstacle (2) Mild Impairment: Is able to step around both cones, but must slow down and adjust steps to clear cones  8. Steps (2) Mild Impairment: Alternating feet, must use rail  Total score 13/24    Score Interpretation: Score of <19 indicates high risk of falls.  Minimally Clinically Important Difference (MCID):  =DGI scores of<21/24 = 1.80 points DGI scores of >21/24 = 0.60 points   Valley T, Inbar-Borovsky N, Brozgol M, Giladi N, [place] JM. The Dynamic Gait Index in healthy older adults: the role of stair climbing, fear of falling and gender. Gait Posture. 2009 Feb;29(2):237-41. doi: 10.1016/j.gaitpost.2008.08.013. Epub 2008 Oct 8. PMID: [id number]; PMCID: EFR7290501.  Pardasaney, MYRTIS LOIS Bonus, GEANNIE POUR., et al. (2012). Sensitivity to change and responsiveness of four balance measures for community-dwelling older adults. Physical therapy 92(3): 388-397.    BERG:  Item Test date: 07/02/24   Sitting to standing 4. able to stand without using hands and stabilize independently  2. Standing unsupported 4. able to stand safely for 2 minutes  3. Sitting with back unsupported, feet supported 4. able to sit safely and securely for 2 minutes  4. Standing to sitting 4. sits safely with  minimal use of hands  5. Pivot transfer  4. able to transfer safely with minor use of  hands  6. Standing unsupported with eyes closed 4. able to stand 10 seconds safely  7. Standing unsupported with feet together 4. able to place feet together independently and stand 1 minute safely  8. Reaching forward with outstretched arms while standing 4. can reach forward confidently 25 cm (10 inches)  9. Pick up object from the floor from standing 4. able to pick up slipper safely and easily  10. Turning to look behind over left and right shoulders while standing 4. looks behind from both sides and weight shifts well  11. Turn 360 degrees 2. able to turn 360 degrees safely but slowly  12. Place alternate foot on step or stool while standing unsupported 4. ble to stand independently and safely and complete 8 steps in 20 seconds  13. Standing unsupported one foot in front 3. able to place foot ahead independently and hold 30 seconds  14. Standing on one leg 2. able to lift leg independently and hold >= 3 seconds    Total Score 51/56    PATIENT SURVEYS:  ABC scale: The Activities-Specific Balance Confidence (ABC) Scale 0% 10 20 30  40 50 60 70 80 90 100% No confidence<->completely confident  "How confident are you that you will not lose your balance or become unsteady when you . . .   Date tested 06/26/24  Walk around the house 80%  2. Walk up or down stairs 80%  3. Bend over and pick up a slipper from in front of a closet floor 10%  4. Reach for a small can off a shelf at eye level 100%  5. Stand on tip toes and reach for something above your head 10%  6. Stand on a chair and reach for something 0%  7. Sweep the floor 80%  8. Walk outside the house to a car parked in the driveway 100%  9. Get into or out of a car 100%  10. Walk across a parking lot to the mall 100%  11. Walk up or down a ramp 100%  12. Walk in a crowded mall where people rapidly walk past you 100%  13. Are bumped into by people as you walk through the mall 100%  14. Step onto or off of an escalator while you are  holding onto the railing 100%  15. Step onto or off an escalator while holding onto parcels such that you cannot hold onto the railing 100%  16. Walk outside on icy sidewalks 60%  Total: #/16 76.25%                                                                                                                                 TREATMENT DATE: 07/11/24 TA- To improve functional movements patterns for everyday tasks    Donned lite gait harness - LiteGait harness applied with patient  in standing position. Straps secured snugly around torso and legs per manufacturer guidelines. Checked for proper alignment, comfort, and safety prior to standing and gait training. All buckles and fasteners verified secure; weight-bearing support adjusted to patient tolerance   Gait training - activities focussed on specific components of gait cycle and multimodal cueing for completion   LiteGait: Total distance 0.50 mi, 2681 ft; Total time 16 min Warm-up: 1 min at 1.5 mph, 1 min at 1.7 mph increasing by 0.1 mph for 4 min.   Reversed downhill incline level 3 progressing to 5,  2 x 4 min starting 1.9 mph and increasing by 0.1 mph every minute   - RPE 18/20   Uphill incline level 4-5; 1 x 4 min starting at 1.8 and increasing by 0.1 mph every minute up to 2.0 mph   TA- To improve functional movements patterns for everyday tasks   Stepped down off of treadmill with supervision without litegait support   Doffed lite gait harness- LiteGait harness removed with patient in standing position. Due to increased abdominal pressure from harness, pt observed for dizziness post removal before transitioning to sitting for recovery.    PATIENT EDUCATION: Education details: Pt. Educated on armed forces technical officer, with suggestion to visit [employer] to determine best shoe option to help alleviate foot pain that has been present since surgery.   Person educated: Patient and daughter Education method: Explanation Education  comprehension: verbalized understanding  HOME EXERCISE PROGRAM: Access Code: [email or web address] URL: [email or web address] Date: 06/26/2024 Prepared by: Sidra Simpers  Exercises - Standing Hip Abduction with Counter Support  - 1 x daily - 3-4 x weekly - 3 sets - 10 reps - Standing Hip Extension with Counter Support  - 1 x daily - 3-4 x weekly - 3 sets - 10 reps - Standing Knee Flexion with Counter Support  - 1 x daily - 7 x weekly - 3 sets - 10 reps - Standing March with Counter Support  - 1 x daily - 3-4 x weekly - 3 sets - 10 reps - Heel Toe Raises with Unilateral Counter Support  - 1 x daily - 7 x weekly - 3 sets - 10 reps  GOALS: Goals reviewed with patient? Yes  SHORT TERM GOALS: Target date: 07/24/2024  Pt will be independent with HEP in order to demonstrate increased ability to perform tasks related to occupation/hobbies. Baseline: Pt given HEP today and encouraged to perform with reps and sets on the page. Goal status: INITIAL  LONG TERM GOALS: Target date: 09/18/2024  1.  Patient (> 40 years old) will complete five times sit to stand test in < 15 seconds indicating an increased LE strength and improved balance. Baseline: 17.40 sec Goal status: INITIAL  2.  Pt will improve DGI by at least 3 points in order to demonstrate clinically significant improvement in balance and decreased risk for falls Baseline: 13/24 Goal status: INITIAL   3.  Pt will improve ABC by at least 13% in order to demonstrate clinically significant improvement in balance confidence. Baseline: 76.25% (pt's daughter states this number is elevated) Goal status: INITIAL   4.  Patient will reduce timed up and go to <11 seconds to reduce fall risk and demonstrate improved transfer/gait ability. Baseline: 16.59 sec Goal status: INITIAL  5.  Patient will increase 10 meter walk test to >1.18m/s as to improve gait speed for better community ambulation and to reduce fall risk. Baseline: 16.95 Goal status:  INITIAL  6.  Patient will increase six minute  walk test distance to >1000 for progression to community ambulator and improve gait ability Baseline: 563' Goal status: INITIAL   ASSESSMENT:  CLINICAL IMPRESSION:  Pt responded well to LiteGait treadmill training today. Focus this session was on increasing gait speed to help with community ambulation. Pt demonstrated a forward posture throughout warm-up, which was improved by moving to downhill incline. After two rounds of downhill walking pt reported RPE of 18/20. Rest breaks were given between 4 min bouts with pt recovering well. Pt required frequent verbal and visual cueing to maintain step length and prevent feet from moving off treadmill track. Pt will continue to benefit from skilled therapy to address remaining deficits in order to improve overall QoL and return to PLOF.      OBJECTIVE IMPAIRMENTS: Abnormal gait, decreased activity tolerance, decreased balance, decreased cognition, decreased coordination, decreased endurance, decreased knowledge of condition, decreased mobility, difficulty walking, decreased strength, and obesity.   ACTIVITY LIMITATIONS: carrying, lifting, bending, squatting, stairs, and locomotion level  PARTICIPATION LIMITATIONS: cleaning, laundry, driving, shopping, community activity, and occupation  PERSONAL FACTORS: Age, Fitness, Past/current experiences, Profession, Time since onset of injury/illness/exacerbation, and 3+ comorbidities: Hx of cancer, DM II, HTN, sleep apnea are also affecting patient's functional outcome.   REHAB POTENTIAL: Good  CLINICAL DECISION MAKING: Evolving/moderate complexity  EVALUATION COMPLEXITY: Moderate  PLAN:  PT FREQUENCY: 1-2x/week  PT DURATION: 12 weeks  PLANNED INTERVENTIONS: 97750- Physical Performance Testing, 97110-Therapeutic exercises, 97530- Therapeutic activity, 97112- Neuromuscular re-education, 97535- Self Care, [id number]- Manual therapy, [phone]- Gait training, [phone]-  Canalith repositioning, Patient/Family education, Balance training, Stair training, Vestibular training, Visual/preceptual remediation/compensation, Cryotherapy, and Moist heat  PLAN FOR NEXT SESSION:   Continue with working on gait speed, consider adding cognitive dual task challenges to increase sustained attention   This entire session was performed under direct supervision and direction of a licensed estate agent . I have personally read, edited and approve of the note as written.    This licensed clinician was present and actively directing care throughout the session at all times.  Lonni KATHEE Gainer PT ,DPT Physical Therapist- [facility]   [place], SPT   07/11/24, 10:07 AM

## 2024-07-11 ENCOUNTER — Ambulatory Visit: Admitting: Physical Therapy

## 2024-07-11 ENCOUNTER — Ambulatory Visit

## 2024-07-11 ENCOUNTER — Ambulatory Visit: Admitting: Speech Pathology

## 2024-07-11 DIAGNOSIS — R41841 Cognitive communication deficit: Secondary | ICD-10-CM

## 2024-07-11 DIAGNOSIS — M6281 Muscle weakness (generalized): Secondary | ICD-10-CM

## 2024-07-11 DIAGNOSIS — R278 Other lack of coordination: Secondary | ICD-10-CM

## 2024-07-11 DIAGNOSIS — R2689 Other abnormalities of gait and mobility: Secondary | ICD-10-CM

## 2024-07-11 NOTE — Therapy (Signed)
 OUTPATIENT SPEECH LANGUAGE PATHOLOGY  COGNITION TREATMENT NOTE   Patient Name: Sergio Kennedy MRN: [id number] DOB:1955-09-13, 68 y.o., male Today's Date: 07/11/2024  PCP: Alm Needle, MD REFERRING PROVIDER: Rosaria CHRISTELLA Lush, MD   End of Session - 07/11/24 1309     Visit Number 3    Number of Visits 25    Date for Recertification  09/24/24    Authorization Type [employer] HMO PPO    Progress Note Due on Visit 10    SLP Start Time 1015    SLP Stop Time  1100    SLP Time Calculation (min) 45 min    Activity Tolerance Patient tolerated treatment well          Past Medical History:  Diagnosis Date   Cancer (HCC)    Class 1 obesity due to excess calories with serious comorbidity and body mass index (BMI) of 34.0 to 34.9 in adult    Diabetes mellitus without complication (HCC)    History of kidney stones    HTN (hypertension), benign    Hyperlipidemia    Hypertension    Hypothyroidism    Hypothyroidism, unspecified    Irregular heart rhythm    Malignant melanoma, unspecified site [facility])    Pre-diabetes    Sleep apnea    Thyroid disease    Past Surgical History:  Procedure Laterality Date   BACK SURGERY  01/2004   COLONOSCOPY N/A 02/15/2024   Procedure: COLONOSCOPY;  Surgeon: Onita Elspeth Sharper, DO;  Location: [facility] ENDOSCOPY;  Service: Gastroenterology;  Laterality: N/A;   EXCISION MASS NECK N/A 04/30/2024   Procedure: EXCISION, MASS, NECK;  Surgeon: Rodolph Romano, MD;  Location: [facility] ORS;  Service: General;  Laterality: N/A;   FOOT SURGERY     melanoma and lymph nodes removed  2023   on back side of the neck   POLYPECTOMY  02/15/2024   Procedure: POLYPECTOMY, INTESTINE;  Surgeon: Onita Elspeth Sharper, DO;  Location: [facility] ENDOSCOPY;  Service: Gastroenterology;;   Patient Active Problem List   Diagnosis Date Noted   Obstructive sleep apnea 05/23/2018   Chronic atrial fibrillation (HCC) 05/23/2018    ONSET DATE: 06/16/2024   REFERRING  DIAG:  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 Pt had right craniotomy for tumor resection on 06/16/2024.   Medical Tests / Procedures Comments: MRI 10/6: Postoperative changes of right frontoparietal craniotomy for mass resection with postoperative blood products within the resection cavity and surgical tract.    Layering blood products in the supratentorial and infratentorial ventricular system without hydrocephalus.    Mild linear and nodular enhancement along the cavity margins, nonspecific, could reflect postsurgical inflammatory changes. Attention on follow-up.    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  Pt with Cognitive Linguistic Evaluation while admitted at [facility] on 06/17/2024. Per chart, pt tested [facility] across cognitive-linguistic domains assessed during testing.  MRI 07/08/2024 Postsurgical changes right frontoparietal craniotomy for mass resection. There is hemorrhage in the resection cavity, in the right basal ganglia, and extending into the right lateral ventricle, overall decreased since prior. There are a few nodular foci of enhancement along the medial margin of the resection cavity (16:122, 16:119, 16:116), more  conspicuous since prior. Similar vasogenic edema in the right frontal lobe. Increased T2/FLAIR hyperintense signal in the right basal ganglia (5:16), likely vasogenic edema. Small amount of hemorrhage layering in the left occipital horn, decreased since prior. There is overall decreased blood products throughout the remaining ventricles. There is also hemosiderin deposition along the left basilar  cistern. Decreased size of evolving bilateral subdural collections, right greater than left.   PAIN:  Are you having pain? No   FALLS: Has patient fallen in last 6 months?  See PT evaluation for details  LIVING ENVIRONMENT: Lives with: lives with their daughter Lives in: House/apartment  PLOF:  Level of assistance: Independent with ADLs, Independent with IADLs Employment: Full-time employment   PATIENT GOALS   to improve cognitive function for return to independent living, driving and working  SUBJECTIVE STATEMENT: Pt eager Pt accompanied by: self, daughter   OBJECTIVE:   TODAY'S TREATMENT:  Skilled treatment session targeted pt's cognitive communication goals. SLP facilitated session by providing the following interventions:  Pt benefited from maximal faded to minimal A to preplan strategies to improve accuracy during a visual scanning task. With support, pt able to state that he needed to decreased his rate or scanning and increase his use of finger to scan with. Pt additionally benefited from moderate cues throughout activity to implement strategies.   Pt's daughter present throughout session and provides the following information instances of functional difficulties: Pt put on shoes that weren't his without awareness He picks up her keys as if they are his without stopping to look at them - pt unaware He attempted to take (incorrect) keys to son-in-law's truck to potentially drive it - hence all keys to vehicles or lawn mowers have been secured Instances of pt's inaccuracy with phone calls and information related to activities were also shared.  Pt with literal interpretations of information - example provided of time at initial discharge when his daughter helping him wash his hair. She instructed pt to leave his underwear on while she helped his wash his hair. Following this, pt left his underwear on while independently bathing the following days because she had told him to  leave them on the one time he needed help.  Pt appeared mildly apathetic throughout the above descriptions  Skilled verbal and written information on gradually including pt with ADLs, establishing locations for his belongings and labeling items/locations.    PATIENT REPORTED OUTCOME MEASURES (PROM): To be completed over the next 3 sessions    PATIENT EDUCATION: Education details: cognitive communication, personality and left inattention/visual field deficits with site of lesion Person educated: Patient and Child(ren) Education method: Medical Illustrator Education comprehension: verbalized understanding and needs further education   HOME EXERCISE PROGRAM:  As above   GOALS:  Goals reviewed with patient? Yes  SHORT TERM GOALS: Target date: 10 sessions  With Moderate A to utilize finger scanning technique to attend to the left most side of the page, patient will read page-level information from a novel with 50% accuracy.  Baseline: Goal status: INITIAL  2.  With moderate A, pt will complete complex medication management task with 75% accuracy.  Baseline:  Goal status: INITIAL  3.  With moderate A, pt will complex money management task with 75& accuracy.  Baseline:  Goal status: INITIAL  4.  With moderate A, pt will demonstrate emergent awareness by self-monitoring and self-correcting errors in 90% of opportunities.  Baseline:  Goal status: INITIAL   LONG TERM GOALS: Target date: 09/24/2024  With  Supervision A, patient will demonstrate ability to complete complex reasoning tasks with 75% accuracy.  Baseline:  Goal status: INITIAL  2.  With Min A, pt will demonstrate anticipatory awareness by providing solution for hypothetical safety situation in 5 out of 7 opportunities.  Baseline:  Goal status: INITIAL  3.  Pt will report improved cognitive communication via PROM by 5 points at last ST session   Baseline:  Goal status:  INITIAL   ASSESSMENT:  CLINICAL IMPRESSION: Patient is a 68 y.o. male  who was seen today for a cognitive communication treatment session d/t recent frontoparietal craniotomy for mass resection. He presents with moderate cognitive impairment c/b severe deficits in memory (short term, working memory, prospective memory), attention (selective and left sided), higher level problem solving, reasoning, executive functions and emergent awareness.   Pt presents with a moderate right hemisphere disorder. See the above treatment note for details.   OBJECTIVE IMPAIRMENTS include attention, memory, awareness, and executive functioning. These impairments are limiting patient from return to work, managing medications, managing appointments, managing finances, household responsibilities, ADLs/IADLs, and effectively communicating at home and in community. Factors affecting potential to achieve goals and functional outcome are medical prognosis, severity of impairments, and future radiation and infusions. Patient will benefit from skilled SLP services to address above impairments and improve overall function.  REHAB POTENTIAL: Good  PLAN: SLP FREQUENCY: 1-2x/week  SLP DURATION: 12 weeks  PLANNED INTERVENTIONS: Environmental controls, Cueing hierachy, Cognitive reorganization, Internal/external aids, Functional tasks, SLP instruction and feedback, Compensatory strategies, and Patient/family education    Savita Runner B. Rubbie, M.S., CCC-SLP, Tree Surgeon Certified Brain Injury Specialist [facility]  [facility] Rehabilitation Services Office [phone] Ascom [phone] Fax [phone]

## 2024-07-11 NOTE — Therapy (Signed)
 OUTPATIENT OCCUPATIONAL THERAPY NEURO TREATMENT NOTE  Patient Name: Sergio Kennedy MRN: [id number] DOB:[birth year], 68 y.o., male Today's Date: 07/11/2024  PCP: Epifanio Alm SQUIBB,  MD REFERRING PROVIDER: Zettie Rosaria HERO, MD  END OF SESSION:  OT End of Session - 07/11/24 1615     Visit Number 4    Number of Visits 24    Date for Recertification  09/18/24    OT Start Time 0930    OT Stop Time 1015    OT Time Calculation (min) 45 min    Activity Tolerance Patient tolerated treatment well    Behavior During Therapy WFL for tasks assessed/performed          Past Medical History:  Diagnosis Date   Cancer (HCC)    Class 1 obesity due to excess calories with serious comorbidity and body mass index (BMI) of 34.0 to 34.9 in adult    Diabetes mellitus without complication (HCC)    History of kidney stones    HTN (hypertension), benign    Hyperlipidemia    Hypertension    Hypothyroidism    Hypothyroidism, unspecified    Irregular heart rhythm    Malignant melanoma, unspecified site [facility])    Pre-diabetes    Sleep apnea    Thyroid disease    Past Surgical History:  Procedure Laterality Date   BACK SURGERY  01/2004   COLONOSCOPY N/A 02/15/2024   Procedure: COLONOSCOPY;  Surgeon: Onita Elspeth Sharper, DO;  Location: [facility] ENDOSCOPY;  Service: Gastroenterology;  Laterality: N/A;   EXCISION MASS NECK N/A 04/30/2024   Procedure: EXCISION, MASS, NECK;  Surgeon: Rodolph Romano, MD;  Location: [facility] ORS;  Service: General;  Laterality: N/A;   FOOT SURGERY     melanoma and lymph nodes removed  2023   on back side of the neck   POLYPECTOMY  02/15/2024   Procedure: POLYPECTOMY, INTESTINE;  Surgeon: Onita Elspeth Sharper, DO;  Location: [facility] ENDOSCOPY;  Service: Gastroenterology;;   Patient Active Problem List   Diagnosis Date Noted   Obstructive sleep apnea 05/23/2018   Chronic atrial fibrillation (HCC) 05/23/2018   ONSET DATE: 06/16/24  REFERRING DIAG: Brain  Lesion  THERAPY DIAG:  Muscle Weakness Other Lack of coordination  Rationale for Evaluation and Treatment: Rehabilitation  SUBJECTIVE:   SUBJECTIVE STATEMENT: Pt reports no other medical appointments today, with plans to go shoe shopping with daughter after therapy. Pt accompanied by: Daughter, Burnard  PERTINENT HISTORY: Pt. was diagnosed with a Large Frontal Intraventricular Mass. Pt. Was diagnosed with Melonoma  with Intraventricular Metastasis. MRI Imaging revealed a 3.5cm enhancing lesion in the Right Frontal Horn without Hydrocephalus but abutting the Foramen of Monro, and fornices. Pt. was admitted to the hospital from 10/6-10/10/25 for a right craniotomy for resection.  PRECAUTIONS: No bending , twisting, lifting over 10#  until cleared by neurosurgery.  WEIGHT BEARING RESTRICTIONS: No  PAIN:  Are you having pain? No  FALLS: Has patient fallen in last 6 months? No  LIVING ENVIRONMENT: Lives with: lives alone  Lives in: House/apartment, 2 levels, lives on the 1st floor. Stairs: 1 stoop. Has following equipment at home: shower chair  PLOF: Independent  PATIENT GOALS: To get back to his old self.  OBJECTIVE:  Note: Objective measures were completed at Evaluation unless otherwise noted.  HAND DOMINANCE: Right  ADLs: Overall ADLs: Cues for step-by-step direction for each ADL task Transfers/ambulation related to ADLs: Eating:  Independent Grooming: Independent standing at the sink to complete UB Dressing: MinA LB Dressing: MinA; Assist  selecting clothing in the morning Toileting: Independently Bathing: Independent Tub Shower transfers: Supervision  IADLs: Shopping: Assist and cuing Light housekeeping: Daughter Meal Prep: Able to get a light snack from the fridge Community mobility: Relies on family and friends Medication management: Per daughter-Pt. Is able to identify Meds, and when they need to be taken. Financial management:  Daughter assists Handwriting:  50% legible Work History: Retired from designer, fashion/clothing; currently working at [employer] Hobbies: Therapist, Music  MOBILITY STATUS:  TBD  ACTIVITY TOLERANCE: Activity tolerance:  Fair  FUNCTIONAL OUTCOME MEASURES: TBD  UPPER EXTREMITY ROM:    Active ROM Right Eval WFL Left Eval [facility]  Shoulder flexion    Shoulder abduction    Shoulder adduction    Shoulder extension    Shoulder internal rotation    Shoulder external rotation    Elbow flexion    Elbow extension    Wrist flexion    Wrist extension    Wrist ulnar deviation    Wrist radial deviation    Wrist pronation    Wrist supination    (Blank rows = not tested)  UPPER EXTREMITY MMT:     MMT Right Eval   Left Eval  Shoulder flexion 5/5 4/5  Shoulder abduction 5/5 4/5  Shoulder adduction    Shoulder extension    Shoulder internal rotation    Shoulder external rotation    Middle trapezius    Lower trapezius    Elbow flexion 5/5 4/5  Elbow extension 5/5 4/5  Wrist flexion 5/5 4/5  Wrist extension 5/5 4/5  Wrist ulnar deviation    Wrist radial deviation    Wrist pronation 5/5 4/5  Wrist supination 5/5 4/5  (Blank rows = not tested)  HAND FUNCTION:  Eval: Grip strength: Right: 70 lbs; Left: 46 lbs, Lateral pinch: Right: 21 lbs, Left: 17 lbs, and 3 point pinch: Right: 14 lbs, Left: 11 lbs  COORDINATION: 9 Hole Peg test: Right: 57 sec; Left: 1 min. &10 sec  SENSATION: WFL  EDEMA: Intact  COGNITION: Overall cognitive status: Impaired  decreased insight into impairments. Per daughter report: limited task initiation for more, and sequencing morning care at home  VISION: Subjective report: Per Pt. report: No changes, per chart  Left visual spatial inattention  VISION ASSESSMENT:  07/02/24 Ocular ROM: [facility] Tracking/Visual pursuits: Decreased smoothness of eye movement to Left superior field and Decreased smoothness of eye movement to Left inferior field Saccades: additional eye  shifts occurred during testing, undershoots, and decreased speed of saccadic movements Visual Fields: no apparent deficits  PERCEPTION: Pt presents with mild L sided inattention  PRAXIS: TBD                                                                                                                           TREATMENT DATE:07/11/24  10/31 23.4x1, 17.9x1 Therapeutic Exercise: -L grip strengthening: Hand gripper set at 23.4# for 1 trial, and reduced to 17.9# for 1 more trial to remove  jumbo pegs from pegboard.  Min vc increasing attention to release time to reduce dropped pegs.  Peg container positioned to pt's L side to increase attention to L visual field. -Facilitated L hand lateral and 3 point pinch strengthening working to place and remove therapy resistant clothespins (all colors) on/off a vertical dowel.  Dowel positioned moved to various locations within L visual field to increase attention to L side.   Therapeutic Activity: -See above object positioning strategies during therapeutic exercise for increasing attention to L side  -Facilitated L hand FMC/dexterity skills working on bead manipulation skills (beads simulating various pill sizes).  Pt practiced opening various size pill tops, picking up pills 1 by 1, storing up to 3-5 in hand, and performing translatory movements to move pills from palm to fingertips in prep to discard pills into bill bottle; min vc and demo for technique -Pt practiced moving pill bottles from midline to far L visual field using L hand to return pill bottles to container  -Pt practiced moving clothespins from midline on table top to far L visual field to return clothespins to container  PATIENT EDUCATION: Education details: activities to increase L sided visual attention  Person educated: Patient and Child(ren) Education method: Explanation, Demonstration, and Verbal cues Education comprehension: verbalized understanding  HOME EXERCISE PROGRAM: Green  theraputty, green theraband for B/LUE strengthening  GOALS: Goals reviewed with patient? No  SHORT TERM GOALS: Target date: 08/07/2024  Pt. Will be independent with HEPs for LUE strength, and bilateral FMC  Baseline: Eval:  Goal status: INITIAL  LONG TERM GOALS: Target date: 09/18/2024  Pt. Will improve LUE strength by 2 mm grades to assist with ADLs, Baseline: Eval: Right: 5/5. L: 4/5 Goal status: INITIAL  2.  Pt. Will increase left grip strength by 5# to be able to securely hold items during ADLs/IADLs. Baseline: Eval: Grip strength: Right: 70 lbs; Left: 46 lbs Goal status: INITIAL  3.  Pt. Will increased Left pinch strength by 3# to be able to efficiently open bottles and containers Baseline: Eval: Lateral pinch: Right: 21 lbs, Left: 17 lbs, and 3 point pinch: Right: 14 lbs, Left: 11 lbs Goal status: INITIAL  4.  Pt. Will improve Left [facility] skills by 3 sec. Of speed to be able to efficiently manipulate small objects during ADLs, and IADLs. Baseline: Eval: 9 Hole Peg test: Right: 57 sec; Left: 1 min. &10 sec Goal status: INITIAL  5.  Pt. Will independently independently, and efficiently be able to navigate through his cellphone. Baseline: Eval: Pt. reports pt. has difficulty  Goal status: INITIAL  6.  Pt. Will independently demonstrate compensatory strategies as needed to increase Left sided visual spatial inattention Baseline: Eval: Recent history of left visual spatial inattention Goal status: INITIAL  ASSESSMENT: CLINICAL IMPRESSION: Pt tolerated all exercises well today, with frequent rest breaks d/t fatigue in L hand.  Pt continues to require intermittent min vc for attention to L visual field, but is fairly consistent to initiate a head turn to the L when verbally cued Are there any more? when searching to move objects around on table top.  Pt demonstrated good ability to manipulate small beads today (simulated pills) with few dropped beads, and was able to open a variety  of pill bottle tops with extra time for problem solving.  Pt will continue to benefit from skilled OT services to work on improving LUE functioning, improving attention to L side, and to provide education on compensatory strategies as needed in order to  increase engagement in, and maximize independence with ADL/IADL tasks.   PERFORMANCE DEFICITS: in functional skills including ADLs, IADLs, coordination, dexterity, proprioception, ROM, strength, pain, Fine motor control, Gross motor control, decreased knowledge of precautions, decreased knowledge of use of DME, vision, and UE functional use, cognitive skills including attention, problem solving, and sequencing, and psychosocial skills including coping strategies, environmental adaptation, interpersonal interactions, and routines and behaviors.   IMPAIRMENTS: are limiting patient from ADLs, IADLs, and leisure.   CO-MORBIDITIES: may have co-morbidities  that affects occupational performance. Patient will benefit from skilled OT to address above impairments and improve overall function.  MODIFICATION OR ASSISTANCE TO COMPLETE EVALUATION: Min-Moderate modification of tasks or assist with assess necessary to complete an evaluation.  OT OCCUPATIONAL PROFILE AND HISTORY: Detailed assessment: Review of records and additional review of physical, cognitive, psychosocial history related to current functional performance.  CLINICAL DECISION MAKING: Moderate - several treatment options, min-mod task modification necessary  REHAB POTENTIAL: Good  EVALUATION COMPLEXITY: Moderate    PLAN:  OT FREQUENCY: 1-2x/week  OT DURATION: 12 weeks  PLANNED INTERVENTIONS: 97168 OT Re-evaluation, 97535 self care/ADL training, 02889 therapeutic exercise, 97530 therapeutic activity, 97112 neuromuscular re-education, 97140 manual therapy, 97010 moist heat, energy conservation, patient/family education, and DME and/or AE instructions  RECOMMENDED OTHER SERVICES: ST  referral requested on 10/16, PT  CONSULTED AND AGREED WITH PLAN OF CARE: Patient  PLAN FOR NEXT SESSION: see above  Inocente Blazing, MS, OTR/L  07/11/2024, 4:17 PM

## 2024-07-16 ENCOUNTER — Ambulatory Visit

## 2024-07-18 ENCOUNTER — Ambulatory Visit

## 2024-07-18 ENCOUNTER — Ambulatory Visit: Admitting: Speech Pathology

## 2024-07-18 ENCOUNTER — Ambulatory Visit: Admitting: Physical Therapy

## 2024-07-23 ENCOUNTER — Ambulatory Visit: Admitting: Speech Pathology

## 2024-07-23 ENCOUNTER — Ambulatory Visit

## 2024-07-25 ENCOUNTER — Ambulatory Visit

## 2024-07-25 ENCOUNTER — Ambulatory Visit: Admitting: Speech Pathology

## 2024-07-29 ENCOUNTER — Ambulatory Visit

## 2024-07-29 ENCOUNTER — Ambulatory Visit: Admitting: Physical Therapy

## 2024-07-29 ENCOUNTER — Ambulatory Visit: Admitting: Speech Pathology

## 2024-07-30 ENCOUNTER — Ambulatory Visit

## 2024-07-30 ENCOUNTER — Ambulatory Visit: Admitting: Physical Therapy

## 2024-08-01 ENCOUNTER — Ambulatory Visit: Attending: Neurosurgery

## 2024-08-01 ENCOUNTER — Ambulatory Visit

## 2024-08-01 ENCOUNTER — Ambulatory Visit: Admitting: Speech Pathology

## 2024-08-01 DIAGNOSIS — M6281 Muscle weakness (generalized): Secondary | ICD-10-CM

## 2024-08-01 DIAGNOSIS — R41841 Cognitive communication deficit: Secondary | ICD-10-CM | POA: Diagnosis present

## 2024-08-01 DIAGNOSIS — R2689 Other abnormalities of gait and mobility: Secondary | ICD-10-CM | POA: Insufficient documentation

## 2024-08-01 DIAGNOSIS — R278 Other lack of coordination: Secondary | ICD-10-CM

## 2024-08-01 NOTE — Therapy (Signed)
 OUTPATIENT PHYSICAL THERAPY NEURO TREATMENT   Patient Name: Sergio Kennedy MRN: [id number] DOB:1956-03-24, 68 y.o., male Today's Date: 08/01/2024   PCP: Epifanio Alm SQUIBB, MD  REFERRING PROVIDER: Zettie Rosaria HERO, MD   END OF SESSION:   PT End of Session - 08/01/24 0947     Visit Number 5    Number of Visits 25    Date for Recertification  09/18/24    PT Start Time 0947    PT Stop Time 1016    PT Time Calculation (min) 29 min    Equipment Utilized During Treatment Gait belt    Behavior During Therapy WFL for tasks assessed/performed           Past Medical History:  Diagnosis Date   Cancer (HCC)    Class 1 obesity due to excess calories with serious comorbidity and body mass index (BMI) of 34.0 to 34.9 in adult    Diabetes mellitus without complication (HCC)    History of kidney stones    HTN (hypertension), benign    Hyperlipidemia    Hypertension    Hypothyroidism    Hypothyroidism, unspecified    Irregular heart rhythm    Malignant melanoma, unspecified site [facility])    Pre-diabetes    Sleep apnea    Thyroid disease    Past Surgical History:  Procedure Laterality Date   BACK SURGERY  01/2004   COLONOSCOPY N/A 02/15/2024   Procedure: COLONOSCOPY;  Surgeon: Onita Elspeth Sharper, DO;  Location: [facility] ENDOSCOPY;  Service: Gastroenterology;  Laterality: N/A;   EXCISION MASS NECK N/A 04/30/2024   Procedure: EXCISION, MASS, NECK;  Surgeon: Rodolph Romano, MD;  Location: [facility] ORS;  Service: General;  Laterality: N/A;   FOOT SURGERY     melanoma and lymph nodes removed  2023   on back side of the neck   POLYPECTOMY  02/15/2024   Procedure: POLYPECTOMY, INTESTINE;  Surgeon: Onita Elspeth Sharper, DO;  Location: [facility] ENDOSCOPY;  Service: Gastroenterology;;   Patient Active Problem List   Diagnosis Date Noted   Obstructive sleep apnea 05/23/2018   Chronic atrial fibrillation (HCC) 05/23/2018    ONSET DATE: 06/16/24  REFERRING DIAG:  G93.9  (ICD-10-CM) - Disorder of brain, unspecified  C43.9 (ICD-10-CM) - Metastatic melanoma (HCC)    THERAPY DIAG:  Muscle weakness (generalized)  Other lack of coordination  Imbalance  Rationale for Evaluation and Treatment: Rehabilitation  SUBJECTIVE:  SUBJECTIVE STATEMENT: 08/01/2024:  Patient reports that he had to cancel his recent appointments due to having radiation therapy at [facility] a couple times a week. He reports that the radiation caused some fatigue, but was otherwise without concerns. He reports that he was walking a little slower initially, but reports that he now feels like he walks pretty well. Patient denies having any pain upon arrival. Patient reports he has completed his radiation therapy, but plans to get infusions set up in near future.   PERTINENT HISTORY: Pt has hx of cancer and tumor removal via craniectomy on 06/16/24.  Pt does have DM, HTN, sleep apnea, and irregular heat rhythm.  PAIN:  Are you having pain? No  PRECAUTIONS: Other: No bending over, no heavy lifting (>10#), and reduce twisting in the neck.  RED FLAGS: None   WEIGHT BEARING RESTRICTIONS: No  FALLS: Has patient fallen in last 6 months? No  LIVING ENVIRONMENT: Lives with: lives with their daughter until pt is recovered. Lives in: House/apartment Stairs: One step entry Has following equipment at home: Walking stick.  PLOF: Independent  PATIENT GOALS: Be able to improve strength back to baseline levels.    OBJECTIVE: Note: Objective measures were completed at Evaluation unless otherwise noted.  DIAGNOSTIC FINDINGS:   EXAM: CT CERVICAL SPINE WITHOUT CONTRAST  IMPRESSION: No recent fracture is seen in cervical spine. 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  From radiology note 07/08/24: EXAM: Magnetic resonance imaging, brain, without and with contrast material. 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 contrast.  FINDINGS: Postsurgical changes right frontoparietal craniotomy for mass resection. There is hemorrhage in the resection cavity, in the right basal ganglia, and extending into the right lateral ventricle, overall decreased since prior. There are a few nodular foci of enhancement along the medial margin of the resection cavity (16:122, 16:119, 16:116), more conspicuous since prior. Similar vasogenic edema in the right frontal lobe. Increased T2/FLAIR hyperintense signal in the right basal ganglia (5:16), likely vasogenic edema. Small amount of hemorrhage layering in the left occipital horn, decreased since prior. There is overall decreased blood products throughout the remaining ventricles. There is also hemosiderin deposition along the left basilar cistern. Decreased size of evolving bilateral subdural collections, right greater than left.  Ex-vacuo dilation of the ventricles, similar to prior. Decreased pneumocephalus. There is no midline shift.  Unchanged 0.7 cm cystic focus in the right sella (14:90). Consider correlation with clinical findings/hormonal analysis. Right cerebellar DVA.  IMPRESSION:  Status post right frontoparietal craniotomy for mass resection with new areas of nodularity along the medial margin suspicious for recurrent/residual disease-attention on follow-up.  Unchanged cystic pituitary lesion.     COGNITION: Overall cognitive status: Within functional limits for tasks assessed   SENSATION: WFL  COORDINATION: Pt with good coordination in the LE's,  reduced quickness with rapid hand turns, and reduced quickness with fingertip touches.  POSTURE: rounded shoulders and forward head  LOWER EXTREMITY MMT:    MMT Right Eval Left Eval  Hip flexion 5 5  Hip extension    Hip abduction    Hip adduction    Hip internal rotation    Hip external rotation    Knee flexion 5 4+  Knee extension 5 4+  Ankle dorsiflexion  5 4  Ankle plantarflexion    Ankle inversion    Ankle eversion    (Blank rows = not tested)  TRANSFERS: Sit to stand: Complete Independence  Assistive device utilized: None     Stand to sit: Complete Independence  Assistive device utilized: None     Chair to chair: Complete Independence  Assistive device utilized: None       RAMP:  Not tested  CURB:  Not tested  STAIRS: Findings: Level of Assistance: Complete Independence, Stair Negotiation Technique: Alternating Pattern  with Bilateral Rails, Number of Stairs: 4, Height of Stairs: 6   , and Comments: pt steady with ambulation, but slowed similar to walking style. GAIT: Findings: Distance walked: 40' and Comments: Pt with slowed gait pattern and reduce trunk rotation.  Pt has little to no arm swing as well and looks unsure/unsteady when ambulating.  FUNCTIONAL TESTS:  5 times sit to stand: 17.40 sec Timed up and go (TUG): 16.59 sec 6 minute walk test: TBD at next visit 10 meter walk test: 16.95 sec  Dynamic Gait Index: Dynamic Gait Index  Mark the lowest level that applies.   Date Performed 06/26/24  Gait level surface (2) Mild Impairment: Walks 20', uses AD, slower speed, mild gait deviations  2. Change in gait speed (1) Moderate Impairment: Makes only minor adjustments to walking speed, or accomplishes a change in speed with significant gait deviations, or changes speed but has significant gait deviations, or changes speed but loses balance but is able to recover and continue walking  3. Gait with horizontal head turns (1) Moderate Impairment: Performs head  turns with moderate change in gait velocity, slows down, staggers but recovers, can continue to walk  4. Gait with vertical head turns (1) Moderate Impairment: Performs head turns with moderate change in gait velocity, slows down, staggers but recovers, can continue to walk  5. Gait and pivot turn (2) Mild Impairment: Pivot turns safely in > 3 seconds and stops with no loss of balance  6. Step over obstacle (2) Mild Impairment: Is able to step over box, but must slow down and adjust steps to clear box safely  7. Step around obstacle (2) Mild Impairment: Is able to step around both cones, but must slow down and adjust steps to clear cones  8. Steps (2) Mild Impairment: Alternating feet, must use rail  Total score 13/24    Score Interpretation: Score of <19 indicates high risk of falls.  Minimally Clinically Important Difference (MCID):  =DGI scores of<21/24 = 1.80 points DGI scores of >21/24 = 0.60 points   Lampeter T, Inbar-Borovsky N, Brozgol M, Giladi N, [place] JM. The Dynamic Gait Index in healthy older adults: the role of stair climbing, fear of falling and gender. Gait Posture. 2009 Feb;29(2):237-41. doi: 10.1016/j.gaitpost.2008.08.013. Epub 2008 Oct 8. PMID: 81154560; PMCID: EFR7290501.  Pardasaney, MYRTIS LOIS Bonus, GEANNIE POUR., et al. (2012). Sensitivity to change and responsiveness of four balance measures for community-dwelling older adults. Physical therapy 92(3): 388-397.    BERG:  Item Test date: 07/02/24   Sitting to standing 4. able to stand without using hands and stabilize independently  2. Standing unsupported 4. able to stand safely for 2 minutes  3. Sitting with back unsupported, feet supported 4. able to sit safely and securely for 2 minutes  4. Standing to sitting 4. sits safely with minimal use of hands  5. Pivot transfer  4. able to transfer safely with minor use of hands  6. Standing unsupported with  eyes closed 4. able to stand 10 seconds safely  7. Standing  unsupported with feet together 4. able to place feet together independently and stand 1 minute safely  8. Reaching forward with outstretched arms while standing 4. can reach forward confidently 25 cm (10 inches)  9. Pick up object from the floor from standing 4. able to pick up slipper safely and easily  10. Turning to look behind over left and right shoulders while standing 4. looks behind from both sides and weight shifts well  11. Turn 360 degrees 2. able to turn 360 degrees safely but slowly  12. Place alternate foot on step or stool while standing unsupported 4. ble to stand independently and safely and complete 8 steps in 20 seconds  13. Standing unsupported one foot in front 3. able to place foot ahead independently and hold 30 seconds  14. Standing on one leg 2. able to lift leg independently and hold >= 3 seconds    Total Score 51/56    PATIENT SURVEYS:  ABC scale: The Activities-Specific Balance Confidence (ABC) Scale 0% 10 20 30  40 50 60 70 80 90 100% No confidence<->completely confident  "How confident are you that you will not lose your balance or become unsteady when you . . .   Date tested 06/26/24  Walk around the house 80%  2. Walk up or down stairs 80%  3. Bend over and pick up a slipper from in front of a closet floor 10%  4. Reach for a small can off a shelf at eye level 100%  5. Stand on tip toes and reach for something above your head 10%  6. Stand on a chair and reach for something 0%  7. Sweep the floor 80%  8. Walk outside the house to a car parked in the driveway 100%  9. Get into or out of a car 100%  10. Walk across a parking lot to the mall 100%  11. Walk up or down a ramp 100%  12. Walk in a crowded mall where people rapidly walk past you 100%  13. Are bumped into by people as you walk through the mall 100%  14. Step onto or off of an escalator while you are holding onto the railing 100%  15. Step onto or off an escalator while holding onto parcels such  that you cannot hold onto the railing 100%  16. Walk outside on icy sidewalks 60%  Total: #/16 76.25%                                                                                                                                 TREATMENT DATE: 08/01/24  Gait Belt with SBA used throughout entirety of today's treatment session for increased safety.   Circuit-like treatment session with 30'' rest breaks between each exercise/task.   1.Gait- 3(450') laps with counting down by 3s.  2.Sit to stand with 5000 gram ball x15  3. Gait - 3 laps. - multitask with animal names 4. Sit to stand with 5000 gram ball x15 5. Gait-3 laps multi-task with animal names       PATIENT EDUCATION: Education details: Pt. Educated on importance of safety with gait and reasoning for performing gait with dual tasks today.    Person educated: Patient and   Education method: Explanation Education comprehension: verbalized understanding  HOME EXERCISE PROGRAM: Access Code: [email or web address] URL: [email or web address] Date: 06/26/2024 Prepared by: Sidra Simpers  Exercises - Standing Hip Abduction with Counter Support  - 1 x daily - 3-4 x weekly - 3 sets - 10 reps - Standing Hip Extension with Counter Support  - 1 x daily - 3-4 x weekly - 3 sets - 10 reps - Standing Knee Flexion with Counter Support  - 1 x daily - 7 x weekly - 3 sets - 10 reps - Standing March with Counter Support  - 1 x daily - 3-4 x weekly - 3 sets - 10 reps - Heel Toe Raises with Unilateral Counter Support  - 1 x daily - 7 x weekly - 3 sets - 10 reps  GOALS: Goals reviewed with patient? Yes  SHORT TERM GOALS: Target date: 07/24/2024  Pt will be independent with HEP in order to demonstrate increased ability to perform tasks related to occupation/hobbies. Baseline: Pt given HEP today and encouraged to perform with reps and sets on the page. Goal status: INITIAL  LONG TERM GOALS: Target date: 09/18/2024  1.  Patient (> 61 years  old) will complete five times sit to stand test in < 15 seconds indicating an increased LE strength and improved balance. Baseline: 17.40 sec Goal status: INITIAL  2.  Pt will improve DGI by at least 3 points in order to demonstrate clinically significant improvement in balance and decreased risk for falls Baseline: 13/24 Goal status: INITIAL   3.  Pt will improve ABC by at least 13% in order to demonstrate clinically significant improvement in balance confidence. Baseline: 76.25% (pt's daughter states this number is elevated) Goal status: INITIAL   4.  Patient will reduce timed up and go to <11 seconds to reduce fall risk and demonstrate improved transfer/gait ability. Baseline: 16.59 sec Goal status: INITIAL  5.  Patient will increase 10 meter walk test to >1.58m/s as to improve gait speed for better community ambulation and to reduce fall risk. Baseline: 16.95 Goal status: INITIAL  6.  Patient will increase six minute walk test distance to >1000 for progression to community ambulator and improve gait ability Baseline: 563' Goal status: INITIAL   ASSESSMENT:  CLINICAL IMPRESSION:  Today we focused on ambulation with multi-tasking on cognitive activities while assessing gait speed, mechanics, and balance. He was noted to show good gait speed and balance when ambulating without cognitive tasks/distractions. However, it was noted when patient had to perform multi-tasking with cognitive task/talking with therapist during gait, he immediately showed decrease in gait speed and slight decrease in balance. He also had difficulty with cognitive tasks with intermittent incorrect responses. He did a combination of gait with cognitive tasks along with sit to stands holding weighted medicine ball today in circuit-like fashion to improve both safety with gait/balance and improve efficiency with sit to stand transfers. Patient performed well following verbal cues to increase gait speed/stride length  when multi-tasking, however, it was difficult for him to maintain increased gait speed and would quickly slow back down with decreased balance when focusing more attention to cognitive tasks.  OBJECTIVE IMPAIRMENTS: Abnormal gait, decreased activity tolerance, decreased balance, decreased cognition, decreased coordination, decreased endurance, decreased knowledge of condition, decreased mobility, difficulty walking, decreased strength, and obesity.   ACTIVITY LIMITATIONS: carrying, lifting, bending, squatting, stairs, and locomotion level  PARTICIPATION LIMITATIONS: cleaning, laundry, driving, shopping, community activity, and occupation  PERSONAL FACTORS: Age, Fitness, Past/current experiences, Profession, Time since onset of injury/illness/exacerbation, and 3+ comorbidities: Hx of cancer, DM II, HTN, sleep apnea are also affecting patient's functional outcome.   REHAB POTENTIAL: Good  CLINICAL DECISION MAKING: Evolving/moderate complexity  EVALUATION COMPLEXITY: Moderate  PLAN:  PT FREQUENCY: 1-2x/week  PT DURATION: 12 weeks  PLANNED INTERVENTIONS: 97750- Physical Performance Testing, 97110-Therapeutic exercises, 97530- Therapeutic activity, [id number]- Neuromuscular re-education, 97535- Self Care, [id number]- Manual therapy, [phone]- Gait training, [phone]- Canalith repositioning, Patient/Family education, Balance training, Stair training, Vestibular training, Visual/preceptual remediation/compensation, Cryotherapy, and Moist heat  PLAN FOR NEXT SESSION:   Continue working to increase sustained attention with gait/safety with dual tasks when performing gait/dynamic activities.   This entire session was performed under direct supervision and direction of a licensed therapist/therapist assistant . I have personally read, edited and approve of the note as written.    This licensed clinician was present and actively directing care throughout the session at all times.  Norman Sharps, PT,  DPT Physical Therapist- [facility]     08/01/24, 11:02 AM

## 2024-08-01 NOTE — Therapy (Unsigned)
 OUTPATIENT OCCUPATIONAL THERAPY NEURO TREATMENT NOTE  Patient Name: Sergio Kennedy MRN: [id number] DOB:03-27-1956, 68 y.o., male Today's Date: 08/01/2024  PCP: Epifanio Alm SQUIBB,  MD REFERRING PROVIDER: Zettie Rosaria HERO, MD  END OF SESSION:    Past Medical History:  Diagnosis Date   Cancer [facility])    Class 1 obesity due to excess calories with serious comorbidity and body mass index (BMI) of 34.0 to 34.9 in adult    Diabetes mellitus without complication (HCC)    History of kidney stones    HTN (hypertension), benign    Hyperlipidemia    Hypertension    Hypothyroidism    Hypothyroidism, unspecified    Irregular heart rhythm    Malignant melanoma, unspecified site [facility])    Pre-diabetes    Sleep apnea    Thyroid disease    Past Surgical History:  Procedure Laterality Date   BACK SURGERY  01/2004   COLONOSCOPY N/A 02/15/2024   Procedure: COLONOSCOPY;  Surgeon: Onita Elspeth Sharper, DO;  Location: [facility] ENDOSCOPY;  Service: Gastroenterology;  Laterality: N/A;   EXCISION MASS NECK N/A 04/30/2024   Procedure: EXCISION, MASS, NECK;  Surgeon: Rodolph Romano, MD;  Location: [facility] ORS;  Service: General;  Laterality: N/A;   FOOT SURGERY     melanoma and lymph nodes removed  2023   on back side of the neck   POLYPECTOMY  02/15/2024   Procedure: POLYPECTOMY, INTESTINE;  Surgeon: Onita Elspeth Sharper, DO;  Location: [facility] ENDOSCOPY;  Service: Gastroenterology;;   Patient Active Problem List   Diagnosis Date Noted   Obstructive sleep apnea 05/23/2018   Chronic atrial fibrillation (HCC) 05/23/2018   ONSET DATE: 06/16/24  REFERRING DIAG: Brain Lesion  THERAPY DIAG:  Muscle Weakness Other Lack of coordination  Rationale for Evaluation and Treatment: Rehabilitation  SUBJECTIVE:   SUBJECTIVE STATEMENT: Pt severe skin rash from immunotherapy, 1 week hospitalized [facility].  Dtr reports nothing has changed physically.  Pt accompanied by: Daughter, Sergio Kennedy  PERTINENT  HISTORY: Pt. was diagnosed with a Large Frontal Intraventricular Mass. Pt. Was diagnosed with Melonoma  with Intraventricular Metastasis. MRI Imaging revealed a 3.5cm enhancing lesion in the Right Frontal Horn without Hydrocephalus but abutting the Foramen of [place], and fornices. Pt. was admitted to the hospital from 10/6-10/10/25 for a right craniotomy for resection.  PRECAUTIONS: No bending , twisting, lifting over 10#  until cleared by neurosurgery.  WEIGHT BEARING RESTRICTIONS: No  PAIN:  Are you having pain? No  FALLS: Has patient fallen in last 6 months? No  LIVING ENVIRONMENT: Lives with: lives alone  Lives in: House/apartment, 2 levels, lives on the 1st floor. Stairs: 1 stoop. Has following equipment at home: shower chair  PLOF: Independent  PATIENT GOALS: To get back to his old self.  OBJECTIVE:  Note: Objective measures were completed at Evaluation unless otherwise noted.  HAND DOMINANCE: Right  ADLs: Overall ADLs: Cues for step-by-step direction for each ADL task Transfers/ambulation related to ADLs: Eating:  Independent Grooming: Independent standing at the sink to complete UB Dressing: MinA LB Dressing: MinA; Assist selecting clothing in the morning Toileting: Independently Bathing: Independent Tub Shower transfers: Supervision  IADLs: Shopping: Assist and cuing Light housekeeping: Daughter Meal Prep: Able to get a light snack from the fridge Community mobility: Relies on family and friends Medication management: Per daughter-Pt. Is able to identify Meds, and when they need to be taken. Financial management:  Daughter assists Handwriting: 50% legible Work History: Retired from designer, fashion/clothing; currently working at [employer] Hobbies:  Fishing  MOBILITY STATUS:  TBD  ACTIVITY TOLERANCE: Activity tolerance:  Fair  FUNCTIONAL OUTCOME MEASURES: TBD  UPPER EXTREMITY ROM:    Active ROM Right Eval WFL Left Eval [facility]   Shoulder flexion    Shoulder abduction    Shoulder adduction    Shoulder extension    Shoulder internal rotation    Shoulder external rotation    Elbow flexion    Elbow extension    Wrist flexion    Wrist extension    Wrist ulnar deviation    Wrist radial deviation    Wrist pronation    Wrist supination    (Blank rows = not tested)  UPPER EXTREMITY MMT:     MMT Right Eval   Left Eval  Shoulder flexion 5/5 4/5  Shoulder abduction 5/5 4/5  Shoulder adduction    Shoulder extension    Shoulder internal rotation    Shoulder external rotation    Middle trapezius    Lower trapezius    Elbow flexion 5/5 4/5  Elbow extension 5/5 4/5  Wrist flexion 5/5 4/5  Wrist extension 5/5 4/5  Wrist ulnar deviation    Wrist radial deviation    Wrist pronation 5/5 4/5  Wrist supination 5/5 4/5  (Blank rows = not tested)  HAND FUNCTION:  Eval: Grip strength: Right: 70 lbs; Left: 46 lbs, Lateral pinch: Right: 21 lbs, Left: 17 lbs, and 3 point pinch: Right: 14 lbs, Left: 11 lbs  COORDINATION: 9 Hole Peg test: Right: 57 sec; Left: 1 min. &10 sec  SENSATION: WFL  EDEMA: Intact  COGNITION: Overall cognitive status: Impaired  decreased insight into impairments. Per daughter report: limited task initiation for more, and sequencing morning care at home  VISION: Subjective report: Per Pt. report: No changes, per chart  Left visual spatial inattention  VISION ASSESSMENT:  07/02/24 Ocular ROM: [facility] Tracking/Visual pursuits: Decreased smoothness of eye movement to Left superior field and Decreased smoothness of eye movement to Left inferior field Saccades: additional eye shifts occurred during testing, undershoots, and decreased speed of saccadic movements Visual Fields: no apparent deficits  PERCEPTION: Pt presents with mild L sided inattention  PRAXIS: TBD                                                                                                                            TREATMENT DATE:07/11/24  10/31 23.4x1, 17.9x1 Therapeutic Exercise: -L grip strengthening: Hand gripper set at 23.4# for 1 trial, and reduced to 17.9# for 1 more trial to remove jumbo pegs from pegboard.  Min vc increasing attention to release time to reduce dropped pegs.  Peg container positioned to pt's L side to increase attention to L visual field. -Facilitated L hand lateral and 3 point pinch strengthening working to place and remove therapy resistant clothespins (all colors) on/off a vertical dowel.  Dowel positioned moved to various locations within L visual field to increase attention to L side.   Therapeutic Activity: -See above  object positioning strategies during therapeutic exercise for increasing attention to L side  -Facilitated L hand FMC/dexterity skills working on bead manipulation skills (beads simulating various pill sizes).  Pt practiced opening various size pill tops, picking up pills 1 by 1, storing up to 3-5 in hand, and performing translatory movements to move pills from palm to fingertips in prep to discard pills into bill bottle; min vc and demo for technique -Pt practiced moving pill bottles from midline to far L visual field using L hand to return pill bottles to container  -Pt practiced moving clothespins from midline on table top to far L visual field to return clothespins to container  PATIENT EDUCATION: Education details: activities to increase L sided visual attention  Person educated: Patient and Child(ren) Education method: Explanation, Demonstration, and Verbal cues Education comprehension: verbalized understanding  HOME EXERCISE PROGRAM: Green theraputty, green theraband for B/LUE strengthening  GOALS: Goals reviewed with patient? No  SHORT TERM GOALS: Target date: 08/07/2024  Pt. Will be independent with HEPs for LUE strength, and bilateral FMC  Baseline: Eval:  Goal status: INITIAL  LONG TERM GOALS: Target date: 09/18/2024  Pt. Will improve LUE  strength by 2 mm grades to assist with ADLs, Baseline: Eval: Right: 5/5. L: 4/5; L shoulder 4+/5 Goal status: INITIAL  2.  Pt. Will increase left grip strength by 5# to be able to securely hold items during ADLs/IADLs. Baseline: Eval: Grip strength: Right: 70 lbs; Left: 46 lbs; Right 78 lbs, Left:  65 lbs  Goal status: INITIAL  3.  Pt. Will increased Left pinch strength by 3# to be able to efficiently open bottles and containers Baseline: Eval: Lateral pinch: Right: 21 lbs, Left: 17 lbs, and 3 point pinch: Right: 14 lbs, Left: 11 lbs Goal status: INITIAL  4.  Pt. Will improve Left [facility] skills by 3 sec. Of speed to be able to efficiently manipulate small objects during ADLs, and IADLs. Baseline: Eval: 9 Hole Peg test: Right: 57 sec; Left: 1 min. &10 sec; Right: 38 sec, Left: 38 sec Goal status: INITIAL  5.  Pt. Will independently independently, and efficiently be able to navigate through his cellphone. Baseline: Eval: Pt. reports pt. has difficulty  Goal status: INITIAL  6.  Pt. Will independently demonstrate compensatory strategies as needed to increase Left sided visual spatial inattention Baseline: Eval: Recent history of left visual spatial inattention Goal status: INITIAL  ASSESSMENT: CLINICAL IMPRESSION: Pt tolerated all exercises well today, with frequent rest breaks d/t fatigue in L hand.  Pt continues to require intermittent min vc for attention to L visual field, but is fairly consistent to initiate a head turn to the L when verbally cued Are there any more? when searching to move objects around on table top.  Pt demonstrated good ability to manipulate small beads today (simulated pills) with few dropped beads, and was able to open a variety of pill bottle tops with extra time for problem solving.  Pt will continue to benefit from skilled OT services to work on improving LUE functioning, improving attention to L side, and to provide education on compensatory strategies as needed  in order to increase engagement in, and maximize independence with ADL/IADL tasks.   PERFORMANCE DEFICITS: in functional skills including ADLs, IADLs, coordination, dexterity, proprioception, ROM, strength, pain, Fine motor control, Gross motor control, decreased knowledge of precautions, decreased knowledge of use of DME, vision, and UE functional use, cognitive skills including attention, problem solving, and sequencing, and psychosocial skills including coping strategies,  environmental adaptation, interpersonal interactions, and routines and behaviors.   IMPAIRMENTS: are limiting patient from ADLs, IADLs, and leisure.   CO-MORBIDITIES: may have co-morbidities  that affects occupational performance. Patient will benefit from skilled OT to address above impairments and improve overall function.  MODIFICATION OR ASSISTANCE TO COMPLETE EVALUATION: Min-Moderate modification of tasks or assist with assess necessary to complete an evaluation.  OT OCCUPATIONAL PROFILE AND HISTORY: Detailed assessment: Review of records and additional review of physical, cognitive, psychosocial history related to current functional performance.  CLINICAL DECISION MAKING: Moderate - several treatment options, min-mod task modification necessary  REHAB POTENTIAL: Good  EVALUATION COMPLEXITY: Moderate    PLAN:  OT FREQUENCY: 1-2x/week  OT DURATION: 12 weeks  PLANNED INTERVENTIONS: 97168 OT Re-evaluation, 97535 self care/ADL training, 02889 therapeutic exercise, 97530 therapeutic activity, 97112 neuromuscular re-education, 97140 manual therapy, 97010 moist heat, energy conservation, patient/family education, and DME and/or AE instructions  RECOMMENDED OTHER SERVICES: ST referral requested on 10/16, PT  CONSULTED AND AGREED WITH PLAN OF CARE: Patient  PLAN FOR NEXT SESSION: see above  Inocente Blazing, MS, OTR/L  08/01/2024, 8:51 AM

## 2024-08-03 NOTE — Therapy (Signed)
 OUTPATIENT SPEECH LANGUAGE PATHOLOGY  COGNITION TREATMENT NOTE   Patient Name: Sergio Kennedy MRN: [id number] DOB:[birth year], 68 y.o., male Today's Date: 08/01/2024  PCP: Alm Needle, MD REFERRING PROVIDER: Rosaria CHRISTELLA Lush, MD   End of Session - 08/01/24 1252     Visit Number 4    Number of Visits 25    Date for Recertification  09/24/24    Authorization Type [employer] HMO PPO    Progress Note Due on Visit 10    SLP Start Time 1015    SLP Stop Time  1100    SLP Time Calculation (min) 45 min    Activity Tolerance Patient tolerated treatment well          Past Medical History:  Diagnosis Date   Cancer (HCC)    Class 1 obesity due to excess calories with serious comorbidity and body mass index (BMI) of 34.0 to 34.9 in adult    Diabetes mellitus without complication (HCC)    History of kidney stones    HTN (hypertension), benign    Hyperlipidemia    Hypertension    Hypothyroidism    Hypothyroidism, unspecified    Irregular heart rhythm    Malignant melanoma, unspecified site [facility])    Pre-diabetes    Sleep apnea    Thyroid disease    Past Surgical History:  Procedure Laterality Date   BACK SURGERY  01/2004   COLONOSCOPY N/A 02/15/2024   Procedure: COLONOSCOPY;  Surgeon: Onita Elspeth Sharper, DO;  Location: [facility] ENDOSCOPY;  Service: Gastroenterology;  Laterality: N/A;   EXCISION MASS NECK N/A 04/30/2024   Procedure: EXCISION, MASS, NECK;  Surgeon: Rodolph Romano, MD;  Location: [facility] ORS;  Service: General;  Laterality: N/A;   FOOT SURGERY     melanoma and lymph nodes removed  2023   on back side of the neck   POLYPECTOMY  02/15/2024   Procedure: POLYPECTOMY, INTESTINE;  Surgeon: Onita Elspeth Sharper, DO;  Location: [facility] ENDOSCOPY;  Service: Gastroenterology;;   Patient Active Problem List   Diagnosis Date Noted   Obstructive sleep apnea 05/23/2018   Chronic atrial fibrillation (HCC) 05/23/2018    ONSET DATE: 06/16/2024   REFERRING  DIAG:  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 Pt had right craniotomy for tumor resection on 06/16/2024.   Medical Tests / Procedures Comments: MRI 10/6: Postoperative changes of right frontoparietal craniotomy for mass resection with postoperative blood products within the resection cavity and surgical tract.    Layering blood products in the supratentorial and infratentorial ventricular system without hydrocephalus.    Mild linear and nodular enhancement along the cavity margins, nonspecific, could reflect postsurgical inflammatory changes. Attention on follow-up.    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  Pt with Cognitive Linguistic Evaluation while admitted at [facility] on 06/17/2024. Per chart, pt tested [place] across cognitive-linguistic domains assessed during testing.  MRI 07/08/2024 Postsurgical changes right frontoparietal craniotomy for mass resection. There is hemorrhage in the resection cavity, in the right basal ganglia, and extending into the right lateral ventricle, overall decreased since prior. There are a few nodular foci of enhancement along the medial margin of the resection cavity (16:122, 16:119, 16:116), more  conspicuous since prior. Similar vasogenic edema in the right frontal lobe. Increased T2/FLAIR hyperintense signal in the right basal ganglia (5:16), likely vasogenic edema. Small amount of hemorrhage layering in the left occipital horn, decreased since prior. There is overall decreased blood products throughout the remaining ventricles. There is also hemosiderin deposition along the left basilar  cistern. Decreased size of evolving bilateral subdural collections, right greater than left.   PAIN:  Are you having pain? No   FALLS: Has patient fallen in last 6 months?  See PT evaluation for details  LIVING ENVIRONMENT: Lives with: lives with their daughter Lives in: House/apartment  PLOF:  Level of assistance: Independent with ADLs, Independent with IADLs Employment: Full-time employment   PATIENT GOALS   to improve cognitive function for return to independent living, driving and working  SUBJECTIVE STATEMENT: Pt eager, missed several weeks of therapy d/t radiation treatments Pt accompanied by: self, daughter   OBJECTIVE:   TODAY'S TREATMENT:  Skilled treatment session targeted pt's cognitive communication goals. SLP facilitated session by providing the following interventions:  Pt arrives to therapy following completion of 5 radiation treatments. He also received 1 infusion but was hospitalized d/t allergic reaction. Pt's daughter attended session and reports that pt's abilities are about the same.   PATIENT REPORTED OUTCOME MEASURES (PROM): To better assess pt's functional abilities the Neuro-QOL Cognitive Function was given to pt and his daughter. Each were asked to complete for comparison d/t suspected pt's lack of insight into deficits/difficulties.    The Neuro-QOLT Item Bank v2.0-Cognition Function-Short Form is an eight-item test designed to measure difficulties with cognitive functioning (e.g., memory, attention and decision making or in the application of such abilities to everyday tasks (e.g., planning, organizing, calculating, remembering and learning). Source: Meg CHARM Haber, J-S, et al. (2012). Neuro-QOL: brief measures of health-related quality of life for clinical research in neurology. Neurology, 78(23), [phone].   In the past 7 days...  I had to read something several times to understand it. Patient response: 4- Rarely (once)  Daughter's response:  5-Never My thinking was slow. Patient Response: 5- Never Daughter's response: 2- Often (once a day) I had to work really hard to pay attention or I would make a mistake. Patient Response: 4- Rarely (once)  Daughter's response: 2- Often (once a day) I had trouble concentrating. Patient Response: 5- Never Daughter's response: 3- Sometimes (2-3 times)  How much DIFFICULTY do you currently have...  Reading and following complex instructions (e.g., directions for a new medication)? Patient's response: 4- A little  Daughter's response: 3- Somewhat Planning for and keeping appointments that are not part of your weekly routine (e.g., a therapy or doctor appointment, or a social gathering with friends and family)? Patient's response: 4- A little  Daughter's response: 4- A little Managing your time to do most of your daily activities? Patient's response: 4- A little  Daughter's response: 3- Somewhat Learning new tasks or instructions? Patient's response: 5- None  Daughter's response: 4- A little   T-SCORE: Patient's response: 18.3; 2.7 SD below mean of 50    Daughter's response: 12.7; 3.8 SD below mean of 50   SLP further facilitated session by providing moderate verbal and visual cues to complete semi-complex monthly calendar task transitioning to putting his therpay appts on monthly calendar for November and December. Unable to fade to minimal cues.   PATIENT EDUCATION: Education details: cognitive communication, personality and left inattention/visual field deficits with site of lesion Person educated: Patient and Child(ren) Education method: Explanation and  Demonstration Education comprehension: verbalized understanding and needs further education   HOME EXERCISE PROGRAM:  Recommend pt begin using a calendar for recall of appts   GOALS:  Goals reviewed with patient? Yes  SHORT TERM GOALS: Target date: 10 sessions  With Moderate A to utilize finger scanning technique to attend to the left  most side of the page, patient will read page-level information from a novel with 50% accuracy.  Baseline: Goal status: INITIAL  2.  With moderate A, pt will complete complex medication management task with 75% accuracy.  Baseline:  Goal status: INITIAL  3.  With moderate A, pt will complex money management task with 75& accuracy.  Baseline:  Goal status: INITIAL  4.  With moderate A, pt will demonstrate emergent awareness by self-monitoring and self-correcting errors in 90% of opportunities.  Baseline:  Goal status: INITIAL   LONG TERM GOALS: Target date: 09/24/2024  With Supervision A, patient will demonstrate ability to complete complex reasoning tasks with 75% accuracy.  Baseline:  Goal status: INITIAL  2.  With Min A, pt will demonstrate anticipatory awareness by providing solution for hypothetical safety situation in 5 out of 7 opportunities.  Baseline:  Goal status: INITIAL  3.  Pt will report improved cognitive communication via PROM by 5 points at last ST session   Baseline:  Goal status: INITIAL   ASSESSMENT:  CLINICAL IMPRESSION: Patient is a 68 y.o. male  who was seen today for a cognitive communication treatment session d/t recent frontoparietal craniotomy for mass resection. He presents with moderate cognitive impairment c/b severe deficits in memory (short term, working memory, prospective memory), attention (selective and left sided), higher level problem solving, reasoning, executive functions and emergent awareness.   Pt continues to present with a moderate right hemisphere disorder. As part of pt with reduced awareness of errors. In addition, when this clinical research associate gave pt his therapy schedule, he handed it to his daughter demonstrating reduced ownership of task. Education provided to pt and his daughter on promoting awareness and ways to increase functional independence. See the above treatment note for details.   OBJECTIVE IMPAIRMENTS include attention, memory,  awareness, and executive functioning. These impairments are limiting patient from return to work, managing medications, managing appointments, managing finances, household responsibilities, ADLs/IADLs, and effectively communicating at home and in community. Factors affecting potential to achieve goals and functional outcome are medical prognosis, severity of impairments, and future radiation and infusions. Patient will benefit from skilled SLP services to address above impairments and improve overall function.  REHAB POTENTIAL: Good  PLAN: SLP FREQUENCY: 1-2x/week  SLP DURATION: 12 weeks  PLANNED INTERVENTIONS: Environmental controls, Cueing hierachy, Cognitive reorganization, Internal/external aids, Functional tasks, SLP instruction and feedback, Compensatory strategies, and Patient/family education    Ankit Degregorio B. Rubbie, M.S., CCC-SLP, Tree Surgeon Certified Brain Injury Specialist [facility]  [facility] Rehabilitation Services Office [phone] Ascom [phone] Fax [phone]

## 2024-08-04 ENCOUNTER — Ambulatory Visit: Admitting: Physical Therapy

## 2024-08-04 ENCOUNTER — Ambulatory Visit: Admitting: Speech Pathology

## 2024-08-04 ENCOUNTER — Ambulatory Visit

## 2024-08-04 DIAGNOSIS — R2689 Other abnormalities of gait and mobility: Secondary | ICD-10-CM

## 2024-08-04 DIAGNOSIS — M6281 Muscle weakness (generalized): Secondary | ICD-10-CM

## 2024-08-04 DIAGNOSIS — R41841 Cognitive communication deficit: Secondary | ICD-10-CM

## 2024-08-04 DIAGNOSIS — R278 Other lack of coordination: Secondary | ICD-10-CM | POA: Diagnosis not present

## 2024-08-04 NOTE — Therapy (Signed)
 OUTPATIENT PHYSICAL THERAPY NEURO TREATMENT   Patient Name: Sergio Kennedy MRN: [id number] DOB:1956-04-30, 68 y.o., male Today's Date: 08/04/2024   PCP: Epifanio Alm SQUIBB, MD  REFERRING PROVIDER: Zettie Rosaria HERO, MD   END OF SESSION:   PT End of Session - 08/04/24 1152     Visit Number 6    Number of Visits 25    Date for Recertification  09/18/24    Progress Note Due on Visit 10    PT Start Time 1107    PT Stop Time 1145    PT Time Calculation (min) 38 min    Equipment Utilized During Treatment Gait belt    Behavior During Therapy WFL for tasks assessed/performed            Past Medical History:  Diagnosis Date   Cancer (HCC)    Class 1 obesity due to excess calories with serious comorbidity and body mass index (BMI) of 34.0 to 34.9 in adult    Diabetes mellitus without complication (HCC)    History of kidney stones    HTN (hypertension), benign    Hyperlipidemia    Hypertension    Hypothyroidism    Hypothyroidism, unspecified    Irregular heart rhythm    Malignant melanoma, unspecified site [facility])    Pre-diabetes    Sleep apnea    Thyroid disease    Past Surgical History:  Procedure Laterality Date   BACK SURGERY  01/2004   COLONOSCOPY N/A 02/15/2024   Procedure: COLONOSCOPY;  Surgeon: Onita Elspeth Sharper, DO;  Location: [facility] ENDOSCOPY;  Service: Gastroenterology;  Laterality: N/A;   EXCISION MASS NECK N/A 04/30/2024   Procedure: EXCISION, MASS, NECK;  Surgeon: Rodolph Romano, MD;  Location: [facility] ORS;  Service: General;  Laterality: N/A;   FOOT SURGERY     melanoma and lymph nodes removed  2023   on back side of the neck   POLYPECTOMY  02/15/2024   Procedure: POLYPECTOMY, INTESTINE;  Surgeon: Onita Elspeth Sharper, DO;  Location: [facility] ENDOSCOPY;  Service: Gastroenterology;;   Patient Active Problem List   Diagnosis Date Noted   Obstructive sleep apnea 05/23/2018   Chronic atrial fibrillation (HCC) 05/23/2018    ONSET DATE:  06/16/24  REFERRING DIAG:  G93.9 (ICD-10-CM) - Disorder of brain, unspecified  C43.9 (ICD-10-CM) - Metastatic melanoma (HCC)    THERAPY DIAG:  Muscle weakness (generalized)  Other lack of coordination  Imbalance  Rationale for Evaluation and Treatment: Rehabilitation  SUBJECTIVE:  SUBJECTIVE STATEMENT: 08/04/24 Pt reports he had a nice weekend, did a nice walk for exercise.   PERTINENT HISTORY: Pt has hx of cancer and tumor removal via craniectomy on 06/16/24.  Pt does have DM, HTN, sleep apnea, and irregular heat rhythm.  PAIN:  Are you having pain? No  PRECAUTIONS: Other: No bending over, no heavy lifting (>10#), and reduce twisting in the neck.  RED FLAGS: None   WEIGHT BEARING RESTRICTIONS: No  FALLS: Has patient fallen in last 6 months? No  LIVING ENVIRONMENT: Lives with: lives with their daughter until pt is recovered. Lives in: House/apartment Stairs: One step entry Has following equipment at home: Walking stick.  PLOF: Independent  PATIENT GOALS: Be able to improve strength back to baseline levels.    OBJECTIVE: Note: Objective measures were completed at Evaluation unless otherwise noted.  DIAGNOSTIC FINDINGS:   EXAM: CT CERVICAL SPINE WITHOUT CONTRAST  IMPRESSION: No recent fracture is seen in cervical spine. 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  From radiology note 07/08/24: EXAM: Magnetic resonance imaging, brain, without and with contrast material. 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 contrast.  FINDINGS: Postsurgical changes right frontoparietal craniotomy for mass resection. There is hemorrhage in the resection cavity, in the right basal ganglia, and extending into the right lateral ventricle, overall decreased since prior. There are a few nodular foci of enhancement along the medial margin of the resection cavity (16:122, 16:119, 16:116), more conspicuous since prior. Similar vasogenic edema in the right frontal lobe. Increased T2/FLAIR hyperintense signal in the right basal ganglia (5:16), likely vasogenic edema. Small amount of hemorrhage layering in the left occipital horn, decreased since prior. There is overall decreased blood products throughout the remaining ventricles. There is also hemosiderin deposition along the left basilar cistern. Decreased size of evolving bilateral subdural collections, right greater than left.  Ex-vacuo dilation of the ventricles, similar to prior. Decreased pneumocephalus. There is no midline shift.  Unchanged 0.7 cm cystic focus in the right sella (14:90). Consider correlation with clinical findings/hormonal analysis. Right cerebellar DVA.  IMPRESSION:  Status post right frontoparietal craniotomy for mass resection with new areas of nodularity along the medial margin suspicious for recurrent/residual disease-attention on follow-up.  Unchanged cystic pituitary lesion.     COGNITION: Overall cognitive status: Within functional limits for tasks assessed   SENSATION: WFL  COORDINATION: Pt with good coordination in the LE's, reduced quickness with rapid hand turns, and reduced quickness with fingertip touches.  POSTURE: rounded shoulders and forward head  LOWER EXTREMITY MMT:    MMT Right Eval Left Eval  Hip flexion 5 5  Hip extension    Hip abduction    Hip adduction    Hip internal rotation    Hip external rotation    Knee flexion 5 4+  Knee extension 5 4+  Ankle dorsiflexion 5 4  Ankle  plantarflexion    Ankle inversion    Ankle eversion    (Blank rows = not tested)  TRANSFERS: Sit to stand: Complete Independence  Assistive device utilized: None     Stand to sit: Complete Independence  Assistive device utilized: None     Chair to chair: Complete Independence  Assistive device utilized: None       RAMP:  Not tested  CURB:  Not tested  STAIRS: Findings: Level of Assistance: Complete Independence, Stair Negotiation Technique: Alternating Pattern  with Bilateral Rails, Number of Stairs: 4, Height of Stairs: 6   , and Comments: pt steady with ambulation, but slowed similar to walking style. GAIT: Findings: Distance walked: 44' and Comments: Pt with slowed gait pattern and reduce trunk rotation.  Pt has little to no arm swing as well and looks unsure/unsteady when ambulating.  FUNCTIONAL TESTS:  5 times sit to stand: 17.40 sec Timed up and go (TUG): 16.59 sec 6 minute walk test: TBD at next visit 10 meter walk test: 16.95 sec  Dynamic Gait Index: Dynamic Gait Index  Mark the lowest level that applies.   Date Performed 06/26/24  Gait level surface (2) Mild Impairment: Walks 20', uses AD, slower speed, mild gait deviations  2. Change in gait speed (1) Moderate Impairment: Makes only minor adjustments to walking speed, or accomplishes a change in speed with significant gait deviations, or changes speed but has significant gait deviations, or changes speed but loses balance but is able to recover and continue walking  3. Gait with horizontal head turns (1) Moderate Impairment: Performs head turns with moderate change in gait velocity, slows down, staggers but recovers, can continue to walk  4. Gait with vertical head turns (1) Moderate Impairment: Performs head turns with moderate change in gait velocity, slows down, staggers but recovers, can continue to walk  5. Gait and pivot turn (2) Mild Impairment: Pivot turns safely in > 3 seconds and stops with no loss of balance   6. Step over obstacle (2) Mild Impairment: Is able to step over box, but must slow down and adjust steps to clear box safely  7. Step around obstacle (2) Mild Impairment: Is able to step around both cones, but must slow down and adjust steps to clear cones  8. Steps (2) Mild Impairment: Alternating feet, must use rail  Total score 13/24    Score Interpretation: Score of <19 indicates high risk of falls.  Minimally Clinically Important Difference (MCID):  =DGI scores of<21/24 = 1.80 points DGI scores of >21/24 = 0.60 points   Ronan T, Inbar-Borovsky N, Brozgol M, Giladi N, [place] JM. The Dynamic Gait Index in healthy older adults: the role of stair climbing, fear of falling and gender. Gait Posture. 2009 Feb;29(2):237-41. doi: 10.1016/j.gaitpost.2008.08.013. Epub 2008 Oct 8. PMID: [id number]; PMCID: [id number].  Pardasaney, MYRTIS LOIS Bonus, GEANNIE POUR., et al. (2012). Sensitivity to change and responsiveness of four balance measures for community-dwelling older adults. Physical therapy 92(3): 388-397.    BERG:  Item Test date: 07/02/24   Sitting to standing 4. able to stand without using hands and stabilize independently  2. Standing unsupported 4. able to stand safely for 2 minutes  3. Sitting with back unsupported, feet supported 4. able to sit safely and securely for 2 minutes  4. Standing to sitting 4. sits safely with minimal use of hands  5. Pivot transfer  4. able to transfer safely with minor use of hands  6. Standing unsupported with eyes closed 4. able to stand 10 seconds safely  7. Standing unsupported with feet together 4. able to place feet together independently and stand 1 minute safely  8. Reaching forward with outstretched arms while standing 4. can reach forward confidently 25 cm (10 inches)  9. Pick up object from the floor from standing 4. able to pick up slipper safely and easily  10. Turning to look behind  over left and right shoulders while standing 4. looks behind  from both sides and weight shifts well  11. Turn 360 degrees 2. able to turn 360 degrees safely but slowly  12. Place alternate foot on step or stool while standing unsupported 4. ble to stand independently and safely and complete 8 steps in 20 seconds  13. Standing unsupported one foot in front 3. able to place foot ahead independently and hold 30 seconds  14. Standing on one leg 2. able to lift leg independently and hold >= 3 seconds    Total Score 51/56    PATIENT SURVEYS:  ABC scale: The Activities-Specific Balance Confidence (ABC) Scale 0% 10 20 30  40 50 60 70 80 90 100% No confidence<->completely confident  "How confident are you that you will not lose your balance or become unsteady when you . . .   Date tested 06/26/24  Walk around the house 80%  2. Walk up or down stairs 80%  3. Bend over and pick up a slipper from in front of a closet floor 10%  4. Reach for a small can off a shelf at eye level 100%  5. Stand on tip toes and reach for something above your head 10%  6. Stand on a chair and reach for something 0%  7. Sweep the floor 80%  8. Walk outside the house to a car parked in the driveway 100%  9. Get into or out of a car 100%  10. Walk across a parking lot to the mall 100%  11. Walk up or down a ramp 100%  12. Walk in a crowded mall where people rapidly walk past you 100%  13. Are bumped into by people as you walk through the mall 100%  14. Step onto or off of an escalator while you are holding onto the railing 100%  15. Step onto or off an escalator while holding onto parcels such that you cannot hold onto the railing 100%  16. Walk outside on icy sidewalks 60%  Total: #/16 76.25%                                                                                                                                 TREATMENT DATE: 08/04/24  Gait Belt with SBA used throughout entirety of today's treatment session for increased safety.   Pt session was cut a little short  today due to pt arriving late to scheduled appointment time  Circuit-like treatment session with 30'' rest breaks between each exercise/task.   Gait- (450') laps with dual task naming restaurants with 2.5# AW .Sit to stand with 4 kgram ball x18 Taps to yoga block (tall end) then step around and tap both feet on all sides x 3 laps around  Gait - 450 ft. - multitask with naming fish types and fishing lure types with 2.5# AW  Sit to stand with 4KG gram ball x18 Taps to yoga block (  tall end) then step around and tap both feet on all sides x 3 laps around with 2.5# AW Gait-3 laps multi-task with animal names in alphabeticlal order- cues for speed maintenance and turns with 2.5#   Sit to stand with 4KG gram ball x18       PATIENT EDUCATION: Education details: Pt. Educated on importance of safety with gait and reasoning for performing gait with dual tasks today.    Person educated: Patient and   Education method: Explanation Education comprehension: verbalized understanding  HOME EXERCISE PROGRAM: Access Code: [email or web address] URL: [email or web address] Date: 06/26/2024 Prepared by: Sidra Simpers  Exercises - Standing Hip Abduction with Counter Support  - 1 x daily - 3-4 x weekly - 3 sets - 10 reps - Standing Hip Extension with Counter Support  - 1 x daily - 3-4 x weekly - 3 sets - 10 reps - Standing Knee Flexion with Counter Support  - 1 x daily - 7 x weekly - 3 sets - 10 reps - Standing March with Counter Support  - 1 x daily - 3-4 x weekly - 3 sets - 10 reps - Heel Toe Raises with Unilateral Counter Support  - 1 x daily - 7 x weekly - 3 sets - 10 reps  GOALS: Goals reviewed with patient? Yes  SHORT TERM GOALS: Target date: 07/24/2024  Pt will be independent with HEP in order to demonstrate increased ability to perform tasks related to occupation/hobbies. Baseline: Pt given HEP today and encouraged to perform with reps and sets on the page. Goal status: INITIAL  LONG  TERM GOALS: Target date: 09/18/2024  1.  Patient (> 26 years old) will complete five times sit to stand test in < 15 seconds indicating an increased LE strength and improved balance. Baseline: 17.40 sec Goal status: INITIAL  2.  Pt will improve DGI by at least 3 points in order to demonstrate clinically significant improvement in balance and decreased risk for falls Baseline: 13/24 Goal status: INITIAL   3.  Pt will improve ABC by at least 13% in order to demonstrate clinically significant improvement in balance confidence. Baseline: 76.25% (pt's daughter states this number is elevated) Goal status: INITIAL   4.  Patient will reduce timed up and go to <11 seconds to reduce fall risk and demonstrate improved transfer/gait ability. Baseline: 16.59 sec Goal status: INITIAL  5.  Patient will increase 10 meter walk test to >1.32m/s as to improve gait speed for better community ambulation and to reduce fall risk. Baseline: 16.95 Goal status: INITIAL  6.  Patient will increase six minute walk test distance to >1000 for progression to community ambulator and improve gait ability Baseline: 563' Goal status: INITIAL   ASSESSMENT:  CLINICAL IMPRESSION:  Continued with current plan of care as laid out in evaluation and recent prior sessions. Pt remains motivated to advance progress toward goals in order to maximize independence and safety at home. Pt requires high level assistance and cuing for completion of exercises in order to provide adequate level of stimulation challenge while minimizing pain and discomfort when possible. Pt closely monitored throughout session pt response and to maximize patient safety during interventions. Pt challenged with dual task and dynamic balance challenges with good response. Pt continues to demonstrate progress toward goals AEB progression of interventions this date either in volume or intensity.     OBJECTIVE IMPAIRMENTS: Abnormal gait, decreased activity  tolerance, decreased balance, decreased cognition, decreased coordination, decreased endurance, decreased knowledge of condition, decreased mobility, difficulty walking,  decreased strength, and obesity.   ACTIVITY LIMITATIONS: carrying, lifting, bending, squatting, stairs, and locomotion level  PARTICIPATION LIMITATIONS: cleaning, laundry, driving, shopping, community activity, and occupation  PERSONAL FACTORS: Age, Fitness, Past/current experiences, Profession, Time since onset of injury/illness/exacerbation, and 3+ comorbidities: Hx of cancer, DM II, HTN, sleep apnea are also affecting patient's functional outcome.   REHAB POTENTIAL: Good  CLINICAL DECISION MAKING: Evolving/moderate complexity  EVALUATION COMPLEXITY: Moderate  PLAN:  PT FREQUENCY: 1-2x/week  PT DURATION: 12 weeks  PLANNED INTERVENTIONS: 97750- Physical Performance Testing, 97110-Therapeutic exercises, 97530- Therapeutic activity, [id number]- Neuromuscular re-education, 97535- Self Care, [id number]- Manual therapy, [phone]- Gait training, [phone]- Canalith repositioning, Patient/Family education, Balance training, Stair training, Vestibular training, Visual/preceptual remediation/compensation, Cryotherapy, and Moist heat  PLAN FOR NEXT SESSION:   Continue working to increase sustained attention with gait/safety with dual tasks when performing gait/dynamic activities.   Note: Portions of this document were prepared using Dragon voice recognition software and although reviewed may contain unintentional dictation errors in syntax, grammar, or spelling.  Lonni KATHEE Gainer PT ,DPT Physical Therapist- [facility]      08/04/24, 11:53 AM

## 2024-08-04 NOTE — Therapy (Signed)
 OUTPATIENT SPEECH LANGUAGE PATHOLOGY  COGNITION TREATMENT NOTE   Patient Name: Sergio Kennedy MRN: [id number] DOB:1956/02/20, 68 y.o., male Today's Date: 08/04/2024  PCP: Alm Needle, MD REFERRING PROVIDER: Rosaria CHRISTELLA Lush, MD   End of Session - 08/04/24 1252     Visit Number 5   Number of Visits 25    Date for Recertification  09/24/24    Authorization Type [employer] HMO PPO    Progress Note Due on Visit 10    SLP Start Time 1145   SLP Stop Time  1230   SLP Time Calculation (min) 45 min    Activity Tolerance Patient tolerated treatment well          Past Medical History:  Diagnosis Date   Cancer (HCC)    Class 1 obesity due to excess calories with serious comorbidity and body mass index (BMI) of 34.0 to 34.9 in adult    Diabetes mellitus without complication (HCC)    History of kidney stones    HTN (hypertension), benign    Hyperlipidemia    Hypertension    Hypothyroidism    Hypothyroidism, unspecified    Irregular heart rhythm    Malignant melanoma, unspecified site [facility])    Pre-diabetes    Sleep apnea    Thyroid disease    Past Surgical History:  Procedure Laterality Date   BACK SURGERY  01/2004   COLONOSCOPY N/A 02/15/2024   Procedure: COLONOSCOPY;  Surgeon: Onita Elspeth Sharper, DO;  Location: [facility] ENDOSCOPY;  Service: Gastroenterology;  Laterality: N/A;   EXCISION MASS NECK N/A 04/30/2024   Procedure: EXCISION, MASS, NECK;  Surgeon: Rodolph Romano, MD;  Location: [facility] ORS;  Service: General;  Laterality: N/A;   FOOT SURGERY     melanoma and lymph nodes removed  2023   on back side of the neck   POLYPECTOMY  02/15/2024   Procedure: POLYPECTOMY, INTESTINE;  Surgeon: Onita Elspeth Sharper, DO;  Location: [facility] ENDOSCOPY;  Service: Gastroenterology;;   Patient Active Problem List   Diagnosis Date Noted   Obstructive sleep apnea 05/23/2018   Chronic atrial fibrillation (HCC) 05/23/2018    ONSET DATE: 06/16/2024   REFERRING DIAG:   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 Pt had right craniotomy for tumor resection on 06/16/2024.   Medical Tests / Procedures Comments: MRI 10/6: Postoperative changes of right frontoparietal craniotomy for mass resection with postoperative blood products within the resection cavity and surgical tract.    Layering blood products in the supratentorial and infratentorial ventricular system without hydrocephalus.    Mild linear and nodular enhancement along the cavity margins, nonspecific, could reflect postsurgical inflammatory changes. Attention on follow-up.    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  Pt with Cognitive Linguistic Evaluation while admitted at [facility] on 06/17/2024. Per chart, pt tested [facility] across cognitive-linguistic domains assessed during testing.  MRI 07/08/2024 Postsurgical changes right frontoparietal craniotomy for mass resection. There is hemorrhage in the resection cavity, in the right basal ganglia, and extending into the right lateral ventricle, overall decreased since prior. There are a few nodular foci of enhancement along the medial margin of the resection cavity (16:122, 16:119, 16:116), more conspicuous since prior.  Similar vasogenic edema in the right frontal lobe. Increased T2/FLAIR hyperintense signal in the right basal ganglia (5:16), likely vasogenic edema. Small amount of hemorrhage layering in the left occipital horn, decreased since prior. There is overall decreased blood products throughout the remaining ventricles. There is also hemosiderin deposition along the left basilar cistern.  Decreased size of evolving bilateral subdural collections, right greater than left.   PAIN:  Are you having pain? No   FALLS: Has patient fallen in last 6 months?  See PT evaluation for details  LIVING ENVIRONMENT: Lives with: lives with their daughter Lives in: House/apartment  PLOF:  Level of assistance: Independent with ADLs, Independent with IADLs Employment: Full-time employment   PATIENT GOALS   to improve cognitive function for return to independent living, driving and working  SUBJECTIVE STATEMENT: Pt eager, missed several weeks of therapy d/t radiation treatments Pt accompanied by: self, daughter   OBJECTIVE:   TODAY'S TREATMENT:  Skilled treatment session targeted pt's cognitive communication goals. SLP facilitated session by providing the following interventions:  Pt required Maximal multimodal assistance to organize monthly calendar according to writtern directions for prednisone working memory, alternating attention between written directions and calendar, problem solving, left inattention, emergent awareness. Pt was abe to identify that he needed a high level of suuport to complete task. Despite this writer's effort pt with no insight into cognitive deficits but stated his difficulty was related to SLP assistance and if tasked with ompletely activity independently he would have been able to. Education provided to his daughte ron ways to redirect pt and not engage in responding to contradicting reasons as provided by pt.   PATIENT EDUCATION: Education details: cognitive communication, personality and left inattention/visual field deficits with site of lesion Person educated: Patient and Child(ren) Education method: Medical Illustrator Education comprehension: verbalized understanding and needs further education   HOME EXERCISE PROGRAM:  Recommend pt begin using a calendar for recall of appts   GOALS:  Goals reviewed with patient? Yes  SHORT TERM  GOALS: Target date: 10 sessions  With Moderate A to utilize finger scanning technique to attend to the left most side of the page, patient will read page-level information from a novel with 50% accuracy.  Baseline: Goal status: INITIAL  2.  With moderate A, pt will complete complex medication management task with 75% accuracy.  Baseline:  Goal status: INITIAL  3.  With moderate A, pt will complex money management task with 75& accuracy.  Baseline:  Goal status: INITIAL  4.  With moderate A, pt will demonstrate emergent awareness by self-monitoring and self-correcting errors in 90% of opportunities.  Baseline:  Goal status: INITIAL   LONG TERM GOALS: Target date: 09/24/2024  With Supervision A, patient will demonstrate ability to complete complex reasoning tasks with 75% accuracy.  Baseline:  Goal status: INITIAL  2.  With Min A, pt will demonstrate anticipatory awareness by providing solution for hypothetical safety situation in 5 out of 7 opportunities.  Baseline:  Goal status: INITIAL  3.  Pt will report improved cognitive communication via PROM by 5 points at last ST session   Baseline:  Goal status: INITIAL   ASSESSMENT:  CLINICAL IMPRESSION: Patient is a 68 y.o. male  who was seen today for a cognitive communication treatment session d/t recent frontoparietal craniotomy for mass resection. He presents with moderate cognitive impairment c/b severe deficits in memory (short term, working memory, prospective memory), attention (selective and left sided), higher level problem solving, reasoning, executive functions and emergent  awareness.   Pt continues to present with a moderate right hemisphere disorder. As part of pt with reduced awareness of errors. In addition, when this clinical research associate gave pt his therapy schedule, he handed it to his daughter demonstrating reduced ownership of task. Education provided to pt and his daughter on promoting awareness and ways to increase functional  independence. See the above treatment note for details.   OBJECTIVE IMPAIRMENTS include attention, memory, awareness, and executive functioning. These impairments are limiting patient from return to work, managing medications, managing appointments, managing finances, household responsibilities, ADLs/IADLs, and effectively communicating at home and in community. Factors affecting potential to achieve goals and functional outcome are medical prognosis, severity of impairments, and future radiation and infusions. Patient will benefit from skilled SLP services to address above impairments and improve overall function.  REHAB POTENTIAL: Good  PLAN: SLP FREQUENCY: 1-2x/week  SLP DURATION: 12 weeks  PLANNED INTERVENTIONS: Environmental controls, Cueing hierachy, Cognitive reorganization, Internal/external aids, Functional tasks, SLP instruction and feedback, Compensatory strategies, and Patient/family education    Happi B. Rubbie, M.S., CCC-SLP, Tree Surgeon Certified Brain Injury Specialist [facility]  [facility] Rehabilitation Services Office [phone] Ascom [phone] Fax [phone]

## 2024-08-06 ENCOUNTER — Ambulatory Visit

## 2024-08-06 ENCOUNTER — Ambulatory Visit: Admitting: Speech Pathology

## 2024-08-06 ENCOUNTER — Ambulatory Visit: Admitting: Occupational Therapy

## 2024-08-06 DIAGNOSIS — R2689 Other abnormalities of gait and mobility: Secondary | ICD-10-CM

## 2024-08-06 DIAGNOSIS — R41841 Cognitive communication deficit: Secondary | ICD-10-CM

## 2024-08-06 DIAGNOSIS — R278 Other lack of coordination: Secondary | ICD-10-CM | POA: Diagnosis not present

## 2024-08-06 DIAGNOSIS — M6281 Muscle weakness (generalized): Secondary | ICD-10-CM

## 2024-08-06 NOTE — Therapy (Signed)
 OUTPATIENT SPEECH LANGUAGE PATHOLOGY  COGNITION TREATMENT NOTE   Patient Name: Sergio Kennedy MRN: [id number] DOB:March 12, 1956, 68 y.o., male Today's Date: 08/06/2024   PCP: Alm Needle, MD REFERRING PROVIDER: Rosaria CHRISTELLA Lush, MD   End of Session - 08/06/24 0854     Visit Number 6    Number of Visits 25    Date for Recertification  09/24/24    Authorization Type [employer] HMO PPO    Progress Note Due on Visit 10    SLP Start Time 0845    SLP Stop Time  0930    SLP Time Calculation (min) 45 min    Activity Tolerance Patient tolerated treatment well            Past Medical History:  Diagnosis Date   Cancer (HCC)    Class 1 obesity due to excess calories with serious comorbidity and body mass index (BMI) of 34.0 to 34.9 in adult    Diabetes mellitus without complication (HCC)    History of kidney stones    HTN (hypertension), benign    Hyperlipidemia    Hypertension    Hypothyroidism    Hypothyroidism, unspecified    Irregular heart rhythm    Malignant melanoma, unspecified site [facility])    Pre-diabetes    Sleep apnea    Thyroid disease    Past Surgical History:  Procedure Laterality Date   BACK SURGERY  01/2004   COLONOSCOPY N/A 02/15/2024   Procedure: COLONOSCOPY;  Surgeon: Onita Elspeth Sharper, DO;  Location: [facility] ENDOSCOPY;  Service: Gastroenterology;  Laterality: N/A;   EXCISION MASS NECK N/A 04/30/2024   Procedure: EXCISION, MASS, NECK;  Surgeon: Rodolph Romano, MD;  Location: [facility] ORS;  Service: General;  Laterality: N/A;   FOOT SURGERY     melanoma and lymph nodes removed  2023   on back side of the neck   POLYPECTOMY  02/15/2024   Procedure: POLYPECTOMY, INTESTINE;  Surgeon: Onita Elspeth Sharper, DO;  Location: [facility] ENDOSCOPY;  Service: Gastroenterology;;   Patient Active Problem List   Diagnosis Date Noted   Obstructive sleep apnea 05/23/2018   Chronic atrial fibrillation (HCC) 05/23/2018    ONSET DATE: 06/16/2024    REFERRING DIAG:  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 Pt had right craniotomy for tumor resection on 06/16/2024.   Medical Tests / Procedures Comments: MRI 10/6: Postoperative changes of right frontoparietal craniotomy for mass resection with postoperative blood products within the resection cavity and surgical tract.    Layering blood products in the supratentorial and infratentorial ventricular system without hydrocephalus.    Mild linear and nodular enhancement along the cavity margins, nonspecific, could reflect postsurgical inflammatory changes. Attention on follow-up.    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  Pt with Cognitive Linguistic Evaluation while admitted at [facility] on 06/17/2024. Per chart, pt tested [facility] across cognitive-linguistic domains assessed during testing.  MRI 07/08/2024 Postsurgical changes right frontoparietal craniotomy for mass resection. There is hemorrhage in the resection cavity, in the right basal ganglia, and extending into the right lateral ventricle, overall decreased since prior. There are a few nodular foci of enhancement along the medial margin of the resection cavity (16:122,  16:119, 16:116), more conspicuous since prior. Similar vasogenic edema in the right frontal lobe. Increased T2/FLAIR hyperintense signal in the right basal ganglia (5:16), likely vasogenic edema. Small amount of hemorrhage layering in the left occipital horn, decreased since prior. There is overall decreased blood products throughout the remaining ventricles. There is also hemosiderin deposition along the left  basilar cistern. Decreased size of evolving bilateral subdural collections, right greater than left.   PAIN:  Are you having pain? No   FALLS: Has patient fallen in last 6 months?  See PT evaluation for details  LIVING ENVIRONMENT: Lives with: lives with their daughter Lives in: House/apartment  PLOF:  Level of assistance: Independent with ADLs, Independent with IADLs Employment: Full-time employment   PATIENT GOALS   to improve cognitive function for return to independent living, driving and working  SUBJECTIVE STATEMENT: Pt eager, reports I've been better d/t pain with having port placed yesterday Pt accompanied by: self, daughter   OBJECTIVE:   TODAY'S TREATMENT:  Skilled treatment session targeted pt's cognitive communication goals. SLP facilitated session by providing the following interventions:  Pt reports port placement yesterday and lack of sleep last night d/t soreness.  Pt with improved ability and self-monitoring/self correction when completing monthly calendar. Moderate verbal cues needed but reduced from previous session.   PATIENT EDUCATION: Education details: cognitive communication, personality and left inattention/visual field deficits with site of lesion Person educated: Patient and Child(ren) Education method: Medical Illustrator Education comprehension: verbalized understanding and needs further education   HOME EXERCISE PROGRAM:  Recommend pt begin using a calendar for recall of appts   GOALS:  Goals reviewed with patient? Yes  SHORT TERM GOALS: Target date: 10 sessions  With Moderate A to utilize finger scanning technique to attend to the left most side of the page, patient will read page-level information from a novel with 50% accuracy.  Baseline: Goal status: INITIAL  2.  With moderate A, pt will complete complex medication management task with 75% accuracy.  Baseline:  Goal status: INITIAL  3.  With moderate A, pt will  complex money management task with 75& accuracy.  Baseline:  Goal status: INITIAL  4.  With moderate A, pt will demonstrate emergent awareness by self-monitoring and self-correcting errors in 90% of opportunities.  Baseline:  Goal status: INITIAL   LONG TERM GOALS: Target date: 09/24/2024  With Supervision A, patient will demonstrate ability to complete complex reasoning tasks with 75% accuracy.  Baseline:  Goal status: INITIAL  2.  With Min A, pt will demonstrate anticipatory awareness by providing solution for hypothetical safety situation in 5 out of 7 opportunities.  Baseline:  Goal status: INITIAL  3.  Pt will report improved cognitive communication via PROM by 5 points at last ST session   Baseline:  Goal status: INITIAL   ASSESSMENT:  CLINICAL IMPRESSION: Patient is a 68 y.o. male  who was seen today for a cognitive communication treatment session d/t recent frontoparietal craniotomy for mass resection. He presents with moderate cognitive impairment c/b severe deficits in memory (short term, working memory, prospective memory), attention (selective and left sided), higher level problem solving, reasoning, executive functions and emergent awareness.   Pt continues to present with a moderate right hemisphere disorder. As part of pt with reduced awareness of errors. In addition, when this clinical research associate gave pt his therapy schedule, he handed it to his daughter demonstrating reduced ownership of task. Education provided to pt and his daughter on promoting awareness and ways to increase functional  independence. See the above treatment note for details.   OBJECTIVE IMPAIRMENTS include attention, memory, awareness, and executive functioning. These impairments are limiting patient from return to work, managing medications, managing appointments, managing finances, household responsibilities, ADLs/IADLs, and effectively communicating at home and in community. Factors affecting potential to  achieve goals and functional outcome are medical prognosis, severity of impairments, and future radiation and infusions. Patient will benefit from skilled SLP services to address above impairments and improve overall function.  REHAB POTENTIAL: Good  PLAN: SLP FREQUENCY: 1-2x/week  SLP DURATION: 12 weeks  PLANNED INTERVENTIONS: Environmental controls, Cueing hierachy, Cognitive reorganization, Internal/external aids, Functional tasks, SLP instruction and feedback, Compensatory strategies, and Patient/family education    Happi B. Rubbie, M.S., CCC-SLP, Tree Surgeon Certified Brain Injury Specialist [facility]  [facility] Rehabilitation Services Office [phone] Ascom [phone] Fax [phone]

## 2024-08-06 NOTE — Therapy (Signed)
 OUTPATIENT PHYSICAL THERAPY NEURO TREATMENT   Patient Name: Sergio Kennedy MRN: [id number] DOB:Aug 06, 1956, 68 y.o., male Today's Date: 08/06/2024   PCP: Epifanio Alm SQUIBB, MD  REFERRING PROVIDER: Zettie Rosaria HERO, MD   END OF SESSION:   PT End of Session - 08/06/24 1023     Visit Number 7    Number of Visits 25    Date for Recertification  09/18/24    PT Start Time 0935    PT Stop Time 1016    PT Time Calculation (min) 41 min    Equipment Utilized During Treatment Gait belt    Activity Tolerance Patient tolerated treatment well             Past Medical History:  Diagnosis Date   Cancer (HCC)    Class 1 obesity due to excess calories with serious comorbidity and body mass index (BMI) of 34.0 to 34.9 in adult    Diabetes mellitus without complication (HCC)    History of kidney stones    HTN (hypertension), benign    Hyperlipidemia    Hypertension    Hypothyroidism    Hypothyroidism, unspecified    Irregular heart rhythm    Malignant melanoma, unspecified site [facility])    Pre-diabetes    Sleep apnea    Thyroid disease    Past Surgical History:  Procedure Laterality Date   BACK SURGERY  01/2004   COLONOSCOPY N/A 02/15/2024   Procedure: COLONOSCOPY;  Surgeon: Onita Elspeth Sharper, DO;  Location: [facility] ENDOSCOPY;  Service: Gastroenterology;  Laterality: N/A;   EXCISION MASS NECK N/A 04/30/2024   Procedure: EXCISION, MASS, NECK;  Surgeon: Rodolph Romano, MD;  Location: [facility] ORS;  Service: General;  Laterality: N/A;   FOOT SURGERY     melanoma and lymph nodes removed  2023   on back side of the neck   POLYPECTOMY  02/15/2024   Procedure: POLYPECTOMY, INTESTINE;  Surgeon: Onita Elspeth Sharper, DO;  Location: [facility] ENDOSCOPY;  Service: Gastroenterology;;   Patient Active Problem List   Diagnosis Date Noted   Obstructive sleep apnea 05/23/2018   Chronic atrial fibrillation (HCC) 05/23/2018    ONSET DATE: 06/16/24  REFERRING DIAG:  G93.9  (ICD-10-CM) - Disorder of brain, unspecified  C43.9 (ICD-10-CM) - Metastatic melanoma (HCC)    THERAPY DIAG:  Muscle weakness (generalized)  Other lack of coordination  Imbalance  Rationale for Evaluation and Treatment: Rehabilitation  SUBJECTIVE:  SUBJECTIVE STATEMENT: 08/06/24 Patient reports that he had a port put in R side of chest yesterday. He reports that he was told he has a lifting restriction for several weeks. Patient reports that he had difficulty sleeping last night due to usually sleeping on R side and new port put in on same day.   PERTINENT HISTORY: Pt has hx of cancer and tumor removal via craniectomy on 06/16/24.  Pt does have DM, HTN, sleep apnea, and irregular heat rhythm.  PAIN:  Are you having pain? No  PRECAUTIONS: Other: No bending over, no heavy lifting (>10#), and reduce twisting in the neck.  RED FLAGS: None   WEIGHT BEARING RESTRICTIONS: No  FALLS: Has patient fallen in last 6 months? No  LIVING ENVIRONMENT: Lives with: lives with their daughter until pt is recovered. Lives in: House/apartment Stairs: One step entry Has following equipment at home: Walking stick.  PLOF: Independent  PATIENT GOALS: Be able to improve strength back to baseline levels.    OBJECTIVE: Note: Objective measures were completed at Evaluation unless otherwise noted.  DIAGNOSTIC FINDINGS:   EXAM: CT CERVICAL SPINE WITHOUT CONTRAST  IMPRESSION: No recent fracture is seen in cervical spine. 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  From radiology note 07/08/24: EXAM: Magnetic resonance imaging, brain, without and with contrast material. 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 contrast.  FINDINGS: Postsurgical changes right frontoparietal craniotomy for mass resection. There is hemorrhage in the resection cavity, in the right basal ganglia, and extending into the right lateral ventricle, overall decreased since prior. There are a few nodular foci of enhancement along the medial margin of the resection cavity (16:122, 16:119, 16:116), more conspicuous since prior. Similar vasogenic edema in the right frontal lobe. Increased T2/FLAIR hyperintense signal in the right basal ganglia (5:16), likely vasogenic edema. Small amount of hemorrhage layering in the left occipital horn, decreased since prior. There is overall decreased blood products throughout the remaining ventricles. There is also hemosiderin deposition along the left basilar cistern. Decreased size of evolving bilateral subdural collections, right greater than left.  Ex-vacuo dilation of the ventricles, similar to prior. Decreased pneumocephalus. There is no midline shift.  Unchanged 0.7 cm cystic focus in the right sella (14:90). Consider correlation with clinical findings/hormonal analysis. Right cerebellar DVA.  IMPRESSION:  Status post right frontoparietal craniotomy for mass resection with new areas of nodularity along the medial margin suspicious for recurrent/residual disease-attention on follow-up.  Unchanged cystic pituitary lesion.     COGNITION: Overall cognitive status: Within functional limits for tasks assessed   SENSATION: WFL  COORDINATION: Pt with good coordination in the LE's, reduced quickness with rapid hand turns, and reduced quickness with fingertip touches.  POSTURE: rounded shoulders and forward head  LOWER EXTREMITY MMT:    MMT Right Eval Left Eval  Hip flexion 5 5  Hip extension     Hip abduction    Hip adduction    Hip internal rotation    Hip external rotation    Knee flexion 5 4+  Knee extension 5 4+  Ankle dorsiflexion 5 4  Ankle plantarflexion    Ankle inversion    Ankle eversion    (Blank rows = not tested)  TRANSFERS: Sit to stand: Complete Independence  Assistive device utilized: None     Stand to sit: Complete Independence  Assistive device utilized: None     Chair to chair: Complete Independence  Assistive device utilized: None       RAMP:  Not tested  CURB:  Not tested  STAIRS: Findings: Level of Assistance: Complete Independence, Stair Negotiation Technique: Alternating Pattern  with Bilateral Rails, Number of Stairs: 4, Height of Stairs: 6   , and Comments: pt steady with ambulation, but slowed similar to walking style. GAIT: Findings: Distance walked: 56' and Comments: Pt with slowed gait pattern and reduce trunk rotation.  Pt has little to no arm swing as well and looks unsure/unsteady when ambulating.  FUNCTIONAL TESTS:  5 times sit to stand: 17.40 sec Timed up and go (TUG): 16.59 sec 6 minute walk test: TBD at next visit 10 meter walk test: 16.95 sec  Dynamic Gait Index: Dynamic Gait Index  Mark the lowest level that applies.   Date Performed 06/26/24  Gait level surface (2) Mild Impairment: Walks 20', uses AD, slower speed, mild gait deviations  2. Change in gait speed (1) Moderate Impairment: Makes only minor adjustments to walking speed, or accomplishes a change in speed with significant gait deviations, or changes speed but has significant gait deviations, or changes speed but loses balance but is able to recover and continue walking  3. Gait with horizontal head turns (1) Moderate Impairment: Performs head turns with moderate change in gait velocity, slows down, staggers but recovers, can continue to walk  4. Gait with vertical head turns (1) Moderate Impairment: Performs head turns with moderate change in gait velocity, slows  down, staggers but recovers, can continue to walk  5. Gait and pivot turn (2) Mild Impairment: Pivot turns safely in > 3 seconds and stops with no loss of balance  6. Step over obstacle (2) Mild Impairment: Is able to step over box, but must slow down and adjust steps to clear box safely  7. Step around obstacle (2) Mild Impairment: Is able to step around both cones, but must slow down and adjust steps to clear cones  8. Steps (2) Mild Impairment: Alternating feet, must use rail  Total score 13/24    Score Interpretation: Score of <19 indicates high risk of falls.  Minimally Clinically Important Difference (MCID):  =DGI scores of<21/24 = 1.80 points DGI scores of >21/24 = 0.60 points   [place] T, Inbar-Borovsky N, Brozgol M, Giladi N, [place] JM. The Dynamic Gait Index in healthy older adults: the role of stair climbing, fear of falling and gender. Gait Posture. 2009 Feb;29(2):237-41. doi: 10.1016/j.gaitpost.2008.08.013. Epub 2008 Oct 8. PMID: 81154560; PMCID: EFR7290501.  Pardasaney, MYRTIS LOIS Bonus, GEANNIE POUR., et al. (2012). Sensitivity to change and responsiveness of four balance measures for community-dwelling older adults. Physical therapy 92(3): 388-397.    BERG:  Item Test date: 07/02/24   Sitting to standing 4. able to stand without using hands and stabilize independently  2. Standing unsupported 4. able to stand safely for 2 minutes  3. Sitting with back unsupported, feet supported 4. able to sit safely and securely for 2 minutes  4. Standing to sitting 4. sits safely with minimal use of hands  5. Pivot transfer  4. able to transfer safely with minor use of hands  6. Standing unsupported with eyes closed 4. able to stand 10 seconds safely  7. Standing unsupported with feet together 4. able to place feet together independently and stand 1 minute safely  8. Reaching  forward with outstretched arms while standing 4. can reach forward confidently 25 cm (10 inches)  9. Pick up object  from the floor from standing 4. able to pick up slipper safely and easily  10. Turning to look behind over left and right shoulders while standing 4. looks behind from both sides and weight shifts well  11. Turn 360 degrees 2. able to turn 360 degrees safely but slowly  12. Place alternate foot on step or stool while standing unsupported 4. ble to stand independently and safely and complete 8 steps in 20 seconds  13. Standing unsupported one foot in front 3. able to place foot ahead independently and hold 30 seconds  14. Standing on one leg 2. able to lift leg independently and hold >= 3 seconds    Total Score 51/56    PATIENT SURVEYS:  ABC scale: The Activities-Specific Balance Confidence (ABC) Scale 0% 10 20 30  40 50 60 70 80 90 100% No confidence<->completely confident  "How confident are you that you will not lose your balance or become unsteady when you . . .   Date tested 06/26/24  Walk around the house 80%  2. Walk up or down stairs 80%  3. Bend over and pick up a slipper from in front of a closet floor 10%  4. Reach for a small can off a shelf at eye level 100%  5. Stand on tip toes and reach for something above your head 10%  6. Stand on a chair and reach for something 0%  7. Sweep the floor 80%  8. Walk outside the house to a car parked in the driveway 100%  9. Get into or out of a car 100%  10. Walk across a parking lot to the mall 100%  11. Walk up or down a ramp 100%  12. Walk in a crowded mall where people rapidly walk past you 100%  13. Are bumped into by people as you walk through the mall 100%  14. Step onto or off of an escalator while you are holding onto the railing 100%  15. Step onto or off an escalator while holding onto parcels such that you cannot hold onto the railing 100%  16. Walk outside on icy sidewalks 60%  Total: #/16 76.25%                                                                                                                                  TREATMENT DATE: 08/06/24  Gait Belt with SBA used throughout entirety of today's treatment session for increased safety.   Gait- (900) 2 laps with dual task naming things in the sea with each letter of alphabet with 2.5# AW  Gait - (900) ft. 2 laps multitask with conversing about patient's hunting stories with 2.5# AW   Toe taps on 12'' step while standing on [employer] with CGA for safety.  Tandem stance at balance station 3x30'' each.  Step-up onto 8'' step without UE assist, CGA x15 each.       PATIENT EDUCATION: Education details: Pt. Educated on importance of safety with gait and reasoning for performing gait with dual tasks today.    Person educated: Patient and   Education method: Explanation Education comprehension: verbalized understanding  HOME EXERCISE PROGRAM: Access Code: [email or web address] URL: [email or web address] Date: 06/26/2024 Prepared by: Sidra Simpers  Exercises - Standing Hip Abduction with Counter Support  - 1 x daily - 3-4 x weekly - 3 sets - 10 reps - Standing Hip Extension with Counter Support  - 1 x daily - 3-4 x weekly - 3 sets - 10 reps - Standing Knee Flexion with Counter Support  - 1 x daily - 7 x weekly - 3 sets - 10 reps - Standing March with Counter Support  - 1 x daily - 3-4 x weekly - 3 sets - 10 reps - Heel Toe Raises with Unilateral Counter Support  - 1 x daily - 7 x weekly - 3 sets - 10 reps  GOALS: Goals reviewed with patient? Yes  SHORT TERM GOALS: Target date: 07/24/2024  Pt will be independent with HEP in order to demonstrate increased ability to perform tasks related to occupation/hobbies. Baseline: Pt given HEP today and encouraged to perform with reps and sets on the page. Goal status: INITIAL  LONG TERM GOALS: Target date: 09/18/2024  1.  Patient (> 38 years old) will complete five times sit to stand test in < 15 seconds indicating an increased LE strength and improved balance. Baseline: 17.40 sec Goal status:  INITIAL  2.  Pt will improve DGI by at least 3 points in order to demonstrate clinically significant improvement in balance and decreased risk for falls Baseline: 13/24 Goal status: INITIAL   3.  Pt will improve ABC by at least 13% in order to demonstrate clinically significant improvement in balance confidence. Baseline: 76.25% (pt's daughter states this number is elevated) Goal status: INITIAL   4.  Patient will reduce timed up and go to <11 seconds to reduce fall risk and demonstrate improved transfer/gait ability. Baseline: 16.59 sec Goal status: INITIAL  5.  Patient will increase 10 meter walk test to >1.29m/s as to improve gait speed for better community ambulation and to reduce fall risk. Baseline: 16.95 Goal status: INITIAL  6.  Patient will increase six minute walk test distance to >1000 for progression to community ambulator and improve gait ability Baseline: 563' Goal status: INITIAL   ASSESSMENT:  CLINICAL IMPRESSION:  Patient ambulated 2x with dual tasking today at 72' with each trial. He continues to have decreased gait speed with cognitive tasks while ambulating along with intermittent veering at times. He then performed various balance exercises today including tandem stance, toe taps on box while standing on foam, and step-ups onto 8'' step. Slight decrease in dynamic balance noted during balance exercises requiring SBA to CGA at times. He became fatigued rather quickly with step-ups and demonstrated decreased strength in lower extremities. Overall, patient continues to work hard to improve as he is motivated throughout PT treatment sessions. Patient will benefit from continuing skilled PT services at this time for improved LE strength, balance, gait, and overall mobility.      OBJECTIVE IMPAIRMENTS: Abnormal gait, decreased activity tolerance, decreased balance, decreased cognition, decreased coordination, decreased endurance, decreased knowledge of condition,  decreased mobility, difficulty walking, decreased strength, and obesity.   ACTIVITY LIMITATIONS: carrying, lifting, bending, squatting, stairs, and locomotion level  PARTICIPATION LIMITATIONS: cleaning,  laundry, driving, shopping, community activity, and occupation  PERSONAL FACTORS: Age, Fitness, Past/current experiences, Profession, Time since onset of injury/illness/exacerbation, and 3+ comorbidities: Hx of cancer, DM II, HTN, sleep apnea are also affecting patient's functional outcome.   REHAB POTENTIAL: Good  CLINICAL DECISION MAKING: Evolving/moderate complexity  EVALUATION COMPLEXITY: Moderate  PLAN:  PT FREQUENCY: 1-2x/week  PT DURATION: 12 weeks  PLANNED INTERVENTIONS: 97750- Physical Performance Testing, 97110-Therapeutic exercises, 97530- Therapeutic activity, [id number]- Neuromuscular re-education, 97535- Self Care, [id number]- Manual therapy, [phone]- Gait training, [phone]- Canalith repositioning, Patient/Family education, Balance training, Stair training, Vestibular training, Visual/preceptual remediation/compensation, Cryotherapy, and Moist heat  PLAN FOR NEXT SESSION:   Continue working to increase sustained attention with gait/safety with dual tasks when performing gait/dynamic activities.   Note: Portions of this document were prepared using Dragon voice recognition software and although reviewed may contain unintentional dictation errors in syntax, grammar, or spelling.  Norman KATHEE Sharps PT ,DPT Physical Therapist- [facility]      08/06/24, 10:35 AM

## 2024-08-13 ENCOUNTER — Ambulatory Visit: Attending: Neurosurgery

## 2024-08-13 ENCOUNTER — Ambulatory Visit

## 2024-08-13 ENCOUNTER — Ambulatory Visit: Admitting: Speech Pathology

## 2024-08-13 DIAGNOSIS — R41841 Cognitive communication deficit: Secondary | ICD-10-CM | POA: Diagnosis present

## 2024-08-13 DIAGNOSIS — R278 Other lack of coordination: Secondary | ICD-10-CM | POA: Insufficient documentation

## 2024-08-13 DIAGNOSIS — M6281 Muscle weakness (generalized): Secondary | ICD-10-CM | POA: Insufficient documentation

## 2024-08-13 DIAGNOSIS — R2689 Other abnormalities of gait and mobility: Secondary | ICD-10-CM | POA: Diagnosis present

## 2024-08-13 NOTE — Therapy (Signed)
 OUTPATIENT PHYSICAL THERAPY NEURO TREATMENT   Patient Name: Sergio Kennedy MRN: [id number] DOB:[birth year], 68 y.o., male Today's Date: 08/13/2024   PCP: Epifanio Alm SQUIBB, MD  REFERRING PROVIDER: Zettie Rosaria HERO, MD   END OF SESSION:   PT End of Session - 08/13/24 0930     Visit Number 8    Number of Visits 25    Date for Recertification  09/18/24    PT Start Time 0930    PT Stop Time 1015    PT Time Calculation (min) 45 min    Equipment Utilized During Treatment Gait belt    Activity Tolerance Patient tolerated treatment well             Past Medical History:  Diagnosis Date   Cancer (HCC)    Class 1 obesity due to excess calories with serious comorbidity and body mass index (BMI) of 34.0 to 34.9 in adult    Diabetes mellitus without complication (HCC)    History of kidney stones    HTN (hypertension), benign    Hyperlipidemia    Hypertension    Hypothyroidism    Hypothyroidism, unspecified    Irregular heart rhythm    Malignant melanoma, unspecified site [facility])    Pre-diabetes    Sleep apnea    Thyroid disease    Past Surgical History:  Procedure Laterality Date   BACK SURGERY  01/2004   COLONOSCOPY N/A 02/15/2024   Procedure: COLONOSCOPY;  Surgeon: Onita Elspeth Sharper, DO;  Location: [facility] ENDOSCOPY;  Service: Gastroenterology;  Laterality: N/A;   EXCISION MASS NECK N/A 04/30/2024   Procedure: EXCISION, MASS, NECK;  Surgeon: Rodolph Romano, MD;  Location: [facility] ORS;  Service: General;  Laterality: N/A;   FOOT SURGERY     melanoma and lymph nodes removed  2023   on back side of the neck   POLYPECTOMY  02/15/2024   Procedure: POLYPECTOMY, INTESTINE;  Surgeon: Onita Elspeth Sharper, DO;  Location: [facility] ENDOSCOPY;  Service: Gastroenterology;;   Patient Active Problem List   Diagnosis Date Noted   Obstructive sleep apnea 05/23/2018   Chronic atrial fibrillation (HCC) 05/23/2018    ONSET DATE: 06/16/24  REFERRING DIAG:  G93.9 (ICD-10-CM)  - Disorder of brain, unspecified  C43.9 (ICD-10-CM) - Metastatic melanoma (HCC)    THERAPY DIAG:  Muscle weakness (generalized)  Other lack of coordination  Imbalance  Rationale for Evaluation and Treatment: Rehabilitation  SUBJECTIVE:  SUBJECTIVE STATEMENT: 08/13/24 Patient reports still having slight soreness in R chest region due to the port being put in last week. He reports that he has continued to try to walk at home and walked one mile yesterday.   PERTINENT HISTORY: Pt has hx of cancer and tumor removal via craniectomy on 06/16/24.  Pt does have DM, HTN, sleep apnea, and irregular heat rhythm.  PAIN:  Are you having pain? No  PRECAUTIONS: Other: No bending over, no heavy lifting (>10#), and reduce twisting in the neck.  RED FLAGS: None   WEIGHT BEARING RESTRICTIONS: No  FALLS: Has patient fallen in last 6 months? No  LIVING ENVIRONMENT: Lives with: lives with their daughter until pt is recovered. Lives in: House/apartment Stairs: One step entry Has following equipment at home: Walking stick.  PLOF: Independent  PATIENT GOALS: Be able to improve strength back to baseline levels.    OBJECTIVE: Note: Objective measures were completed at Evaluation unless otherwise noted.  DIAGNOSTIC FINDINGS:   EXAM: CT CERVICAL SPINE WITHOUT CONTRAST  IMPRESSION: No recent fracture is seen in cervical spine. 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  From radiology note 07/08/24: EXAM: Magnetic resonance imaging, brain, without and with contrast material. 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 contrast.  FINDINGS: Postsurgical changes right frontoparietal craniotomy for mass resection. There is hemorrhage in the resection cavity, in the right basal ganglia, and extending into the right lateral ventricle, overall decreased since prior. There are a few nodular foci of enhancement along the medial margin of the resection cavity (16:122, 16:119, 16:116), more conspicuous since prior. Similar vasogenic edema in the right frontal lobe. Increased T2/FLAIR hyperintense signal in the right basal ganglia (5:16), likely vasogenic edema. Small amount of hemorrhage layering in the left occipital horn, decreased since prior. There is overall decreased blood products throughout the remaining ventricles. There is also hemosiderin deposition along the left basilar cistern. Decreased size of evolving bilateral subdural collections, right greater than left.  Ex-vacuo dilation of the ventricles, similar to prior. Decreased pneumocephalus. There is no midline shift.  Unchanged 0.7 cm cystic focus in the right sella (14:90). Consider correlation with clinical findings/hormonal analysis. Right cerebellar DVA.  IMPRESSION:  Status post right frontoparietal craniotomy for mass resection with new areas of nodularity along the medial margin suspicious for recurrent/residual disease-attention on follow-up.  Unchanged cystic pituitary lesion.     COGNITION: Overall cognitive status: Within functional limits for tasks assessed   SENSATION: WFL  COORDINATION: Pt with good coordination in the LE's, reduced quickness with rapid hand turns, and reduced quickness with fingertip touches.  POSTURE: rounded shoulders and forward head  LOWER EXTREMITY MMT:    MMT Right Eval Left Eval  Hip flexion 5 5  Hip extension    Hip abduction    Hip adduction    Hip internal rotation    Hip external rotation     Knee flexion 5 4+  Knee extension 5 4+  Ankle dorsiflexion 5 4  Ankle plantarflexion    Ankle inversion    Ankle eversion    (Blank rows = not tested)  TRANSFERS: Sit to stand: Complete Independence  Assistive device utilized: None     Stand to sit: Complete Independence  Assistive device  utilized: None     Chair to chair: Complete Independence  Assistive device utilized: None       RAMP:  Not tested  CURB:  Not tested  STAIRS: Findings: Level of Assistance: Complete Independence, Stair Negotiation Technique: Alternating Pattern  with Bilateral Rails, Number of Stairs: 4, Height of Stairs: 6   , and Comments: pt steady with ambulation, but slowed similar to walking style. GAIT: Findings: Distance walked: 72' and Comments: Pt with slowed gait pattern and reduce trunk rotation.  Pt has little to no arm swing as well and looks unsure/unsteady when ambulating.  FUNCTIONAL TESTS:  5 times sit to stand: 17.40 sec Timed up and go (TUG): 16.59 sec 6 minute walk test: TBD at next visit 10 meter walk test: 16.95 sec  Dynamic Gait Index: Dynamic Gait Index  Mark the lowest level that applies.   Date Performed 06/26/24  Gait level surface (2) Mild Impairment: Walks 20', uses AD, slower speed, mild gait deviations  2. Change in gait speed (1) Moderate Impairment: Makes only minor adjustments to walking speed, or accomplishes a change in speed with significant gait deviations, or changes speed but has significant gait deviations, or changes speed but loses balance but is able to recover and continue walking  3. Gait with horizontal head turns (1) Moderate Impairment: Performs head turns with moderate change in gait velocity, slows down, staggers but recovers, can continue to walk  4. Gait with vertical head turns (1) Moderate Impairment: Performs head turns with moderate change in gait velocity, slows down, staggers but recovers, can continue to walk  5. Gait and pivot turn (2) Mild  Impairment: Pivot turns safely in > 3 seconds and stops with no loss of balance  6. Step over obstacle (2) Mild Impairment: Is able to step over box, but must slow down and adjust steps to clear box safely  7. Step around obstacle (2) Mild Impairment: Is able to step around both cones, but must slow down and adjust steps to clear cones  8. Steps (2) Mild Impairment: Alternating feet, must use rail  Total score 13/24    Score Interpretation: Score of <19 indicates high risk of falls.  Minimally Clinically Important Difference (MCID):  =DGI scores of<21/24 = 1.80 points DGI scores of >21/24 = 0.60 points   [place] T, Inbar-Borovsky N, Brozgol M, Giladi N, [place] JM. The Dynamic Gait Index in healthy older adults: the role of stair climbing, fear of falling and gender. Gait Posture. 2009 Feb;29(2):237-41. doi: 10.1016/j.gaitpost.2008.08.013. Epub 2008 Oct 8. PMID: 81154560; PMCID: EFR7290501.  Pardasaney, MYRTIS LOIS Bonus, GEANNIE POUR., et al. (2012). Sensitivity to change and responsiveness of four balance measures for community-dwelling older adults. Physical therapy 92(3): 388-397.    BERG:  Item Test date: 07/02/24   Sitting to standing 4. able to stand without using hands and stabilize independently  2. Standing unsupported 4. able to stand safely for 2 minutes  3. Sitting with back unsupported, feet supported 4. able to sit safely and securely for 2 minutes  4. Standing to sitting 4. sits safely with minimal use of hands  5. Pivot transfer  4. able to transfer safely with minor use of hands  6. Standing unsupported with eyes closed 4. able to stand 10 seconds safely  7. Standing unsupported with feet together 4. able to place feet together independently and stand 1 minute safely  8. Reaching forward with outstretched arms while standing 4. can reach forward confidently 25 cm (10 inches)  9. Pick up object from the floor from standing 4. able to pick up slipper safely and easily  10.  Turning to look behind over left and right shoulders while standing 4. looks behind from both sides and weight shifts well  11. Turn 360 degrees 2. able to turn 360 degrees safely but slowly  12. Place alternate foot on step or stool while standing unsupported 4. ble to stand independently and safely and complete 8 steps in 20 seconds  13. Standing unsupported one foot in front 3. able to place foot ahead independently and hold 30 seconds  14. Standing on one leg 2. able to lift leg independently and hold >= 3 seconds    Total Score 51/56    PATIENT SURVEYS:  ABC scale: The Activities-Specific Balance Confidence (ABC) Scale 0% 10 20 30  40 50 60 70 80 90 100% No confidence<->completely confident  "How confident are you that you will not lose your balance or become unsteady when you . . .   Date tested 06/26/24  Walk around the house 80%  2. Walk up or down stairs 80%  3. Bend over and pick up a slipper from in front of a closet floor 10%  4. Reach for a small can off a shelf at eye level 100%  5. Stand on tip toes and reach for something above your head 10%  6. Stand on a chair and reach for something 0%  7. Sweep the floor 80%  8. Walk outside the house to a car parked in the driveway 100%  9. Get into or out of a car 100%  10. Walk across a parking lot to the mall 100%  11. Walk up or down a ramp 100%  12. Walk in a crowded mall where people rapidly walk past you 100%  13. Are bumped into by people as you walk through the mall 100%  14. Step onto or off of an escalator while you are holding onto the railing 100%  15. Step onto or off an escalator while holding onto parcels such that you cannot hold onto the railing 100%  16. Walk outside on icy sidewalks 60%  Total: #/16 76.25%                                                                                                                                 TREATMENT DATE: 08/13/24  Gait Belt with SBA used throughout entirety of  today's treatment session for increased safety.   Circuit:   Sit to stand x20  1 minute rest  Gait (900)' with dual (cognitive) task + stairwell x25 steps -1 lap.  1 minute rest.  Sit to stand x20 Gait (900)' with dual (cognitive) task + stairwell x25 steps -1 lap.  1 minute rest.  Sit to stand x20  -Reactive Balance   -Pertubation on Airex x35 in random directions.   -Ball  thrown overhead from behind with single  step out and catch. 20 catches.  -Toe taps on 12'' box while standing on airex 20xeach.  -SLB - 2x30''          PATIENT EDUCATION: Education details: Pt. Educated on importance of safety with gait and reasoning for performing gait with dual tasks today.    Person educated: Patient and   Education method: Explanation Education comprehension: verbalized understanding  HOME EXERCISE PROGRAM: Access Code: [email or web address] URL: [email or web address] Date: 06/26/2024 Prepared by: Sidra Simpers  Exercises - Standing Hip Abduction with Counter Support  - 1 x daily - 3-4 x weekly - 3 sets - 10 reps - Standing Hip Extension with Counter Support  - 1 x daily - 3-4 x weekly - 3 sets - 10 reps - Standing Knee Flexion with Counter Support  - 1 x daily - 7 x weekly - 3 sets - 10 reps - Standing March with Counter Support  - 1 x daily - 3-4 x weekly - 3 sets - 10 reps - Heel Toe Raises with Unilateral Counter Support  - 1 x daily - 7 x weekly - 3 sets - 10 reps  GOALS: Goals reviewed with patient? Yes  SHORT TERM GOALS: Target date: 07/24/2024  Pt will be independent with HEP in order to demonstrate increased ability to perform tasks related to occupation/hobbies. Baseline: Pt given HEP today and encouraged to perform with reps and sets on the page. Goal status: INITIAL  LONG TERM GOALS: Target date: 09/18/2024  1.  Patient (> 29 years old) will complete five times sit to stand test in < 15 seconds indicating an increased LE strength and improved  balance. Baseline: 17.40 sec Goal status: INITIAL  2.  Pt will improve DGI by at least 3 points in order to demonstrate clinically significant improvement in balance and decreased risk for falls Baseline: 13/24 Goal status: INITIAL   3.  Pt will improve ABC by at least 13% in order to demonstrate clinically significant improvement in balance confidence. Baseline: 76.25% (pt's daughter states this number is elevated) Goal status: INITIAL   4.  Patient will reduce timed up and go to <11 seconds to reduce fall risk and demonstrate improved transfer/gait ability. Baseline: 16.59 sec Goal status: INITIAL  5.  Patient will increase 10 meter walk test to >1.68m/s as to improve gait speed for better community ambulation and to reduce fall risk. Baseline: 16.95 Goal status: INITIAL  6.  Patient will increase six minute walk test distance to >1000 for progression to community ambulator and improve gait ability Baseline: 563' Goal status: INITIAL   ASSESSMENT:  CLINICAL IMPRESSION:  Patient began performing exercises in a circuit-like fashion with a combination of gait, stairs, and squats with 1-minute rest between each activity to improve endurance/cardio response and to increase heart rate. Patient tolerated the circuit well, but did show signs of fatigue near end of activity. He reported that he liked adding the stairs today as that is something he has not done a lot of recently. Reactive balance and dynamic balance was also performed today with various exercises to improve reactive time and strategies to maintain balance following sudden LOB. SLS was performed near end of treatment with more difficulty on L leg compared to R without LOB. Patient did require verb cues to increase gait speed with dual-task today as he still tends to have difficulty keeping pace when carrying a conversation.      OBJECTIVE IMPAIRMENTS: Abnormal gait, decreased activity tolerance, decreased balance, decreased  cognition, decreased coordination,  decreased endurance, decreased knowledge of condition, decreased mobility, difficulty walking, decreased strength, and obesity.   ACTIVITY LIMITATIONS: carrying, lifting, bending, squatting, stairs, and locomotion level  PARTICIPATION LIMITATIONS: cleaning, laundry, driving, shopping, community activity, and occupation  PERSONAL FACTORS: Age, Fitness, Past/current experiences, Profession, Time since onset of injury/illness/exacerbation, and 3+ comorbidities: Hx of cancer, DM II, HTN, sleep apnea are also affecting patient's functional outcome.   REHAB POTENTIAL: Good  CLINICAL DECISION MAKING: Evolving/moderate complexity  EVALUATION COMPLEXITY: Moderate  PLAN:  PT FREQUENCY: 1-2x/week  PT DURATION: 12 weeks  PLANNED INTERVENTIONS: 97750- Physical Performance Testing, 97110-Therapeutic exercises, 97530- Therapeutic activity, [id number]- Neuromuscular re-education, 97535- Self Care, [id number]- Manual therapy, [phone]- Gait training, [phone]- Canalith repositioning, Patient/Family education, Balance training, Stair training, Vestibular training, Visual/preceptual remediation/compensation, Cryotherapy, and Moist heat  PLAN FOR NEXT SESSION:   Continue working to increase sustained attention with gait/safety with dual tasks when performing gait/dynamic activities. Continue working to improve dynamic balance with progressive exercises.    Note: Portions of this document were prepared using Dragon voice recognition software and although reviewed may contain unintentional dictation errors in syntax, grammar, or spelling.  Norman KATHEE Sharps PT ,DPT Physical Therapist- [facility]      08/13/24, 10:17 AM

## 2024-08-13 NOTE — Therapy (Signed)
 OUTPATIENT SPEECH LANGUAGE PATHOLOGY  COGNITION TREATMENT NOTE   Patient Name: Sergio Kennedy MRN: [id number] DOB:[birth year], 68 y.o., male Today's Date: 08/13/2024   PCP: Alm Needle, MD REFERRING PROVIDER: Rosaria CHRISTELLA Lush, MD   End of Session - 08/13/24 [phone]     Visit Number 7    Number of Visits 25    Date for Recertification  09/24/24    Authorization Type [employer] HMO PPO    Progress Note Due on Visit 10    SLP Start Time [phone]    SLP Stop Time  0930    SLP Time Calculation (min) 43 min    Activity Tolerance Patient tolerated treatment well            Past Medical History:  Diagnosis Date   Cancer (HCC)    Class 1 obesity due to excess calories with serious comorbidity and body mass index (BMI) of 34.0 to 34.9 in adult    Diabetes mellitus without complication (HCC)    History of kidney stones    HTN (hypertension), benign    Hyperlipidemia    Hypertension    Hypothyroidism    Hypothyroidism, unspecified    Irregular heart rhythm    Malignant melanoma, unspecified site [facility])    Pre-diabetes    Sleep apnea    Thyroid disease    Past Surgical History:  Procedure Laterality Date   BACK SURGERY  01/2004   COLONOSCOPY N/A 02/15/2024   Procedure: COLONOSCOPY;  Surgeon: Onita Elspeth Sharper, DO;  Location: [facility] ENDOSCOPY;  Service: Gastroenterology;  Laterality: N/A;   EXCISION MASS NECK N/A 04/30/2024   Procedure: EXCISION, MASS, NECK;  Surgeon: Rodolph Romano, MD;  Location: [facility] ORS;  Service: General;  Laterality: N/A;   FOOT SURGERY     melanoma and lymph nodes removed  2023   on back side of the neck   POLYPECTOMY  02/15/2024   Procedure: POLYPECTOMY, INTESTINE;  Surgeon: Onita Elspeth Sharper, DO;  Location: [facility] ENDOSCOPY;  Service: Gastroenterology;;   Patient Active Problem List   Diagnosis Date Noted   Obstructive sleep apnea 05/23/2018   Chronic atrial fibrillation (HCC) 05/23/2018    ONSET DATE: 06/16/2024    REFERRING DIAG:  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 Pt had right craniotomy for tumor resection on 06/16/2024.   Medical Tests / Procedures Comments: MRI 10/6: Postoperative changes of right frontoparietal craniotomy for mass resection with postoperative blood products within the resection cavity and surgical tract.    Layering blood products in the supratentorial and infratentorial ventricular system without hydrocephalus.    Mild linear and nodular enhancement along the cavity margins, nonspecific, could reflect postsurgical inflammatory changes. Attention on follow-up.    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  Pt with Cognitive Linguistic Evaluation while admitted at [facility] on 06/17/2024. Per chart, pt tested [facility] across cognitive-linguistic domains assessed during testing.  MRI 07/08/2024 Postsurgical changes right frontoparietal craniotomy for mass resection. There is hemorrhage in the resection cavity, in the right basal ganglia, and extending into the right lateral ventricle, overall decreased since prior. There are a few nodular foci of enhancement along the medial margin of the resection cavity (16:122,  16:119, 16:116), more conspicuous since prior. Similar vasogenic edema in the right frontal lobe. Increased T2/FLAIR hyperintense signal in the right basal ganglia (5:16), likely vasogenic edema. Small amount of hemorrhage layering in the left occipital horn, decreased since prior. There is overall decreased blood products throughout the remaining ventricles. There is also hemosiderin deposition along the left  basilar cistern. Decreased size of evolving bilateral subdural collections, right greater than left.   PAIN:  Are you having pain? No   FALLS: Has patient fallen in last 6 months?  See PT evaluation for details  LIVING ENVIRONMENT: Lives with: lives with their daughter Lives in: House/apartment  PLOF:  Level of assistance: Independent with ADLs, Independent with IADLs Employment: Full-time employment   PATIENT GOALS   to improve cognitive function for return to independent living, driving and working  SUBJECTIVE STATEMENT: Pt eager, social, reports good Thanksgiving Pt accompanied by: self, daughter   OBJECTIVE:   TODAY'S TREATMENT:  Skilled treatment session targeted pt's cognitive communication goals. SLP facilitated session by providing the following interventions:  Pt reports success with Thanksgiving food preparation (with assistance) and attending all social gatherings. Pt's daughter reports at home practice with pt reading his medication bottles and organizing his pill container - states he did pretty good. Pt's daughter bought a pill dispenser that is timed to promote pt's independence with medications as pt intermittently forgets what day it is.   Pt independently put 9 functional activities with correct details on a monthly calendar with 75% accuracy - improved to 100% accuracy given mod A. Pt required cues for planning and emergent awareness.   When scheduling medicine tapers on a monthly calendar, pt required max A for orientation to calendar (he began on first blank box with no date; after cues to start on today's date, pt still began on Dec 1 vs today's date) and for semi complex problem solving and reasoning (he began erasing correct Prednisone information and replacing with omeprazole information with no awareness).   PATIENT EDUCATION: Education details: cognitive communication, personality and left inattention/visual field deficits with site of lesion Person  educated: Patient and Child(ren) Education method: Medical Illustrator Education comprehension: verbalized understanding and needs further education   HOME EXERCISE PROGRAM:  Recommend pt begin using a calendar for recall of appts   GOALS:  Goals reviewed with patient? Yes  SHORT TERM GOALS: Target date: 10 sessions  With Moderate A to utilize finger scanning technique to attend to the left most side of the page, patient will read page-level information from a novel with 50% accuracy.  Baseline: Goal status: INITIAL  2.  With moderate A, pt will complete complex medication management task with 75% accuracy.  Baseline:  Goal status: INITIAL  3.  With moderate A, pt will complex money management task with 75& accuracy.  Baseline:  Goal status: INITIAL  4.  With moderate A, pt will demonstrate emergent awareness by self-monitoring and self-correcting errors in 90% of opportunities.  Baseline:  Goal status: INITIAL   LONG TERM GOALS: Target date: 09/24/2024  With Supervision A, patient will demonstrate ability to complete complex reasoning tasks with 75% accuracy.  Baseline:  Goal status: INITIAL  2.  With Min A, pt will demonstrate anticipatory awareness by providing solution for hypothetical safety situation in 5 out of 7 opportunities.  Baseline:  Goal status: INITIAL  3.  Pt will report improved cognitive communication via PROM by 5 points at last ST session   Baseline:  Goal status: INITIAL  ASSESSMENT:  CLINICAL IMPRESSION: Patient is a 68 y.o. male  who was seen today for a cognitive communication treatment session d/t recent frontoparietal craniotomy for mass resection. He presents with moderate cognitive impairment c/b severe deficits in memory (short term, working memory, prospective memory), attention (selective and left sided), higher level problem solving, reasoning, executive functions and emergent awareness.   Pt continues to present with a  moderate right hemisphere disorder. As part of pt with reduced awareness of errors. In addition, when this clinical research associate gave pt his therapy schedule, he handed it to his daughter demonstrating reduced ownership of task. Education provided to pt and his daughter on promoting awareness and ways to increase functional independence. See the above treatment note for details.   OBJECTIVE IMPAIRMENTS include attention, memory, awareness, and executive functioning. These impairments are limiting patient from return to work, managing medications, managing appointments, managing finances, household responsibilities, ADLs/IADLs, and effectively communicating at home and in community. Factors affecting potential to achieve goals and functional outcome are medical prognosis, severity of impairments, and future radiation and infusions. Patient will benefit from skilled SLP services to address above impairments and improve overall function.  REHAB POTENTIAL: Good  PLAN: SLP FREQUENCY: 1-2x/week  SLP DURATION: 12 weeks  PLANNED INTERVENTIONS: Environmental controls, Cueing hierachy, Cognitive reorganization, Internal/external aids, Functional tasks, SLP instruction and feedback, Compensatory strategies, and Patient/family education    Happi B. Rubbie, M.S., CCC-SLP, Tree Surgeon Certified Brain Injury Specialist [facility]  [facility] Rehabilitation Services Office [phone] Ascom [phone] Fax [phone]

## 2024-08-15 ENCOUNTER — Ambulatory Visit: Admitting: Speech Pathology

## 2024-08-15 ENCOUNTER — Ambulatory Visit: Admitting: Physical Therapy

## 2024-08-15 ENCOUNTER — Ambulatory Visit

## 2024-08-20 ENCOUNTER — Ambulatory Visit

## 2024-08-20 ENCOUNTER — Ambulatory Visit: Admitting: Speech Pathology

## 2024-08-20 ENCOUNTER — Ambulatory Visit: Admitting: Occupational Therapy

## 2024-08-20 DIAGNOSIS — M6281 Muscle weakness (generalized): Secondary | ICD-10-CM | POA: Diagnosis not present

## 2024-08-20 DIAGNOSIS — R41841 Cognitive communication deficit: Secondary | ICD-10-CM

## 2024-08-20 NOTE — Therapy (Signed)
 OUTPATIENT SPEECH LANGUAGE PATHOLOGY  COGNITION TREATMENT NOTE   Patient Name: Sergio Kennedy MRN: [id number] DOB:[birth year], 68 y.o., male Today's Date: 08/20/2024   PCP: Alm Needle, MD REFERRING PROVIDER: Rosaria CHRISTELLA Lush, MD   End of Session - 08/20/24 0848     Visit Number 8    Number of Visits 25    Date for Recertification  09/24/24    Authorization Type [employer] Medicare HMO PPO    Progress Note Due on Visit 10    SLP Start Time 0845    SLP Stop Time  0930    SLP Time Calculation (min) 45 min    Activity Tolerance Patient tolerated treatment well            Past Medical History:  Diagnosis Date   Cancer (HCC)    Class 1 obesity due to excess calories with serious comorbidity and body mass index (BMI) of 34.0 to 34.9 in adult    Diabetes mellitus without complication (HCC)    History of kidney stones    HTN (hypertension), benign    Hyperlipidemia    Hypertension    Hypothyroidism    Hypothyroidism, unspecified    Irregular heart rhythm    Malignant melanoma, unspecified site [facility])    Pre-diabetes    Sleep apnea    Thyroid disease    Past Surgical History:  Procedure Laterality Date   BACK SURGERY  01/2004   COLONOSCOPY N/A 02/15/2024   Procedure: COLONOSCOPY;  Surgeon: Onita Elspeth Sharper, DO;  Location: [facility] ENDOSCOPY;  Service: Gastroenterology;  Laterality: N/A;   EXCISION MASS NECK N/A 04/30/2024   Procedure: EXCISION, MASS, NECK;  Surgeon: Rodolph Romano, MD;  Location: [facility] ORS;  Service: General;  Laterality: N/A;   FOOT SURGERY     melanoma and lymph nodes removed  2023   on back side of the neck   POLYPECTOMY  02/15/2024   Procedure: POLYPECTOMY, INTESTINE;  Surgeon: Onita Elspeth Sharper, DO;  Location: [facility] ENDOSCOPY;  Service: Gastroenterology;;   Patient Active Problem List   Diagnosis Date Noted   Obstructive sleep apnea 05/23/2018   Chronic atrial fibrillation (HCC) 05/23/2018    ONSET DATE: 06/16/2024    REFERRING DIAG:  G93.9 (ICD-10-CM) - Disorder of brain, unspecified  C43.9 (ICD-10-CM) - Malignant melanoma of skin, unspecified    THERAPY DIAG:  Cognitive communication deficit  Rationale for Evaluation and Treatment Rehabilitation  SUBJECTIVE:   PERTINENT HISTORY and DIAGNOSTIC FINDINGS: : Pt is a 67 year old male with past medical history of melanoma with right intraventricular metastasis. Pt had right craniotomy for tumor resection on 06/16/2024.   Medical Tests / Procedures Comments: MRI 10/6: Postoperative changes of right frontoparietal craniotomy for mass resection with postoperative blood products within the resection cavity and surgical tract.    Layering blood products in the supratentorial and infratentorial ventricular system without hydrocephalus.    Mild linear and nodular enhancement along the cavity margins, nonspecific, could reflect postsurgical inflammatory changes. Attention on follow-up.    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  Pt with Cognitive Linguistic Evaluation while admitted at [facility] on 06/17/2024. Per chart, pt tested [facility] across cognitive-linguistic domains assessed during testing.  07/04/2024 Cycle #1 ipilimumab 3mg /kg + nivolumab 1mg /kg immunotherapy  MRI 07/08/2024 Postsurgical changes right frontoparietal craniotomy for mass resection. There is hemorrhage in the resection cavity, in the right basal ganglia, and extending into the right lateral ventricle, overall decreased since prior. There are a few nodular foci of  enhancement along the medial margin of the resection cavity (16:122, 16:119, 16:116), more conspicuous since prior. Similar vasogenic edema in the right frontal lobe. Increased T2/FLAIR hyperintense signal in the right basal ganglia (5:16), likely vasogenic edema. Small amount of hemorrhage layering in the left occipital horn, decreased since prior. There is overall decreased blood products throughout the  remaining ventricles. There is also hemosiderin deposition along the left basilar cistern. Decreased size of evolving bilateral subdural collections, right greater than left.  07/12/2024 thru 07/16/2024 admitted for grade 4 dermatitis secondary to ipilimumab/nivomumab immunotherapy  07/28/2024 Completion of fifth fraction of SBRT using [employer].  Treatment Site: Brain Tumor Volume Fx dose: 600 cGy Total dose thus far received: 3000/3000 cGy   08/16/2024 cycle #2 ipilimumab 3mg /kg + nivolumab 1mg /kg   PAIN:  Are you having pain? No   FALLS: Has patient fallen in last 6 months?  See PT evaluation for details  LIVING ENVIRONMENT: Lives with: lives with their daughter Lives in: House/apartment  PLOF:  Level of assistance: Independent with ADLs, Independent with IADLs Employment: Full-time employment   PATIENT GOALS   to improve cognitive function for return to independent living, driving and working  SUBJECTIVE STATEMENT: Pt eager, appeared somewhat fatigue - his daughter reported he forgot he had therapy today. Pt accompanied by: self, daughter   OBJECTIVE:   TODAY'S TREATMENT:  Skilled treatment session targeted pt's cognitive communication goals. SLP facilitated session by providing the following interventions:  Pt appeared fatigued but report that cycle #2 of immunotherapy went well on Friday -   Pt benefited from maximal multimodal cues to learn semi-complex novel game specifically for working memory, short term memory, reasoning, awareness, turn taking. When switching to more basic task, pt benefited from Min A for selective/simple alternating attention, and problem solving for task completion.   PATIENT EDUCATION: Education details: cognitive communication, personality and left inattention/visual field deficits with site of lesion Person educated: Patient and Child(ren) Education method: Medical Illustrator Education comprehension: verbalized  understanding and needs further education   HOME EXERCISE PROGRAM:  Recommend pt begin using a calendar for recall of appts   GOALS:  Goals reviewed with patient? Yes  SHORT TERM GOALS: Target date: 10 sessions  With Moderate A to utilize finger scanning technique to attend to the left most side of the page, patient will read page-level information from a novel with 50% accuracy.  Baseline: Goal status: INITIAL  2.  With moderate A, pt will complete complex medication management task with 75% accuracy.  Baseline:  Goal status: INITIAL  3.  With moderate A, pt will complex money management task with 75& accuracy.  Baseline:  Goal status: INITIAL  4.  With moderate A, pt will demonstrate emergent awareness by self-monitoring and self-correcting errors in 90% of opportunities.  Baseline:  Goal status: INITIAL   LONG TERM GOALS: Target date: 09/24/2024  With Supervision A, patient will demonstrate ability to complete complex reasoning tasks with 75% accuracy.  Baseline:  Goal status: INITIAL  2.  With Min A, pt will demonstrate anticipatory awareness by providing solution for hypothetical safety situation in 5 out of 7 opportunities.  Baseline:  Goal status: INITIAL  3.  Pt will report improved cognitive communication via PROM by 5 points at last ST session   Baseline:  Goal status: INITIAL   ASSESSMENT:  CLINICAL IMPRESSION: Patient is a 68 y.o. male  who was seen today for a cognitive communication treatment session d/t recent frontoparietal craniotomy for mass resection. He  presents with moderate cognitive impairment c/b severe deficits in memory (short term, working memory, prospective memory), attention (selective and left sided), higher level problem solving, reasoning, executive functions and emergent awareness.   Pt continues to present with a moderate right hemisphere disorder. See the above treatment note for details.   OBJECTIVE IMPAIRMENTS include  attention, memory, awareness, and executive functioning. These impairments are limiting patient from return to work, managing medications, managing appointments, managing finances, household responsibilities, ADLs/IADLs, and effectively communicating at home and in community. Factors affecting potential to achieve goals and functional outcome are medical prognosis, severity of impairments, and future radiation and infusions. Patient will benefit from skilled SLP services to address above impairments and improve overall function.  REHAB POTENTIAL: Good  PLAN: SLP FREQUENCY: 1-2x/week  SLP DURATION: 12 weeks  PLANNED INTERVENTIONS: Environmental controls, Cueing hierachy, Cognitive reorganization, Internal/external aids, Functional tasks, SLP instruction and feedback, Compensatory strategies, and Patient/family education    Mikias Lanz B. Rubbie, M.S., CCC-SLP, Tree Surgeon Certified Brain Injury Specialist [facility]  [facility] Rehabilitation Services Office [phone] Ascom [phone] Fax [phone]

## 2024-08-20 NOTE — Therapy (Signed)
 OUTPATIENT PHYSICAL THERAPY NEURO TREATMENT   Patient Name: Sergio Kennedy MRN: [id number] DOB:07-01-1956, 68 y.o., male Today's Date: 08/20/2024   PCP: Epifanio Alm SQUIBB, MD  REFERRING PROVIDER: Zettie Rosaria HERO, MD   END OF SESSION:   PT End of Session - 08/20/24 0931     Visit Number 9    Number of Visits 25    Date for Recertification  09/18/24    PT Start Time 0930    PT Stop Time 1009    PT Time Calculation (min) 39 min    Equipment Utilized During Treatment Gait belt    Activity Tolerance Patient tolerated treatment well             Past Medical History:  Diagnosis Date   Cancer (HCC)    Class 1 obesity due to excess calories with serious comorbidity and body mass index (BMI) of 34.0 to 34.9 in adult    Diabetes mellitus without complication (HCC)    History of kidney stones    HTN (hypertension), benign    Hyperlipidemia    Hypertension    Hypothyroidism    Hypothyroidism, unspecified    Irregular heart rhythm    Malignant melanoma, unspecified site [facility])    Pre-diabetes    Sleep apnea    Thyroid disease    Past Surgical History:  Procedure Laterality Date   BACK SURGERY  01/2004   COLONOSCOPY N/A 02/15/2024   Procedure: COLONOSCOPY;  Surgeon: Onita Elspeth Sharper, DO;  Location: [facility] ENDOSCOPY;  Service: Gastroenterology;  Laterality: N/A;   EXCISION MASS NECK N/A 04/30/2024   Procedure: EXCISION, MASS, NECK;  Surgeon: Rodolph Romano, MD;  Location: [facility] ORS;  Service: General;  Laterality: N/A;   FOOT SURGERY     melanoma and lymph nodes removed  2023   on back side of the neck   POLYPECTOMY  02/15/2024   Procedure: POLYPECTOMY, INTESTINE;  Surgeon: Onita Elspeth Sharper, DO;  Location: [facility] ENDOSCOPY;  Service: Gastroenterology;;   Patient Active Problem List   Diagnosis Date Noted   Obstructive sleep apnea 05/23/2018   Chronic atrial fibrillation (HCC) 05/23/2018    ONSET DATE: 06/16/24  REFERRING DIAG:  G93.9  (ICD-10-CM) - Disorder of brain, unspecified  C43.9 (ICD-10-CM) - Metastatic melanoma (HCC)    THERAPY DIAG:  Muscle weakness (generalized)  Other lack of coordination  Imbalance  Rationale for Evaluation and Treatment: Rehabilitation  SUBJECTIVE:  SUBJECTIVE STATEMENT: 08/20/24 Patient reports he has been a little slugish the past few days because he had infusion this past Friday. He reports still having swelling and soreness at port site in R side of chest. Patient denies recent falls.   PERTINENT HISTORY: Pt has hx of cancer and tumor removal via craniectomy on 06/16/24.  Pt does have DM, HTN, sleep apnea, and irregular heat rhythm.  PAIN:  Are you having pain? No  PRECAUTIONS: Other: No bending over, no heavy lifting (>10#), and reduce twisting in the neck.  RED FLAGS: None   WEIGHT BEARING RESTRICTIONS: No  FALLS: Has patient fallen in last 6 months? No  LIVING ENVIRONMENT: Lives with: lives with their daughter until pt is recovered. Lives in: House/apartment Stairs: One step entry Has following equipment at home: Walking stick.  PLOF: Independent  PATIENT GOALS: Be able to improve strength back to baseline levels.    OBJECTIVE: Note: Objective measures were completed at Evaluation unless otherwise noted.  DIAGNOSTIC FINDINGS:   EXAM: CT CERVICAL SPINE WITHOUT CONTRAST  IMPRESSION: No recent fracture is seen in cervical spine. 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  From radiology note 07/08/24: EXAM: Magnetic resonance imaging, brain, without and with contrast material. 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 contrast.  FINDINGS: Postsurgical changes right frontoparietal craniotomy for mass resection. There is hemorrhage in the resection cavity, in the right basal ganglia, and extending into the right lateral ventricle, overall decreased since prior. There are a few nodular foci of enhancement along the medial margin of the resection cavity (16:122, 16:119, 16:116), more conspicuous since prior. Similar vasogenic edema in the right frontal lobe. Increased T2/FLAIR hyperintense signal in the right basal ganglia (5:16), likely vasogenic edema. Small amount of hemorrhage layering in the left occipital horn, decreased since prior. There is overall decreased blood products throughout the remaining ventricles. There is also hemosiderin deposition along the left basilar cistern. Decreased size of evolving bilateral subdural collections, right greater than left.  Ex-vacuo dilation of the ventricles, similar to prior. Decreased pneumocephalus. There is no midline shift.  Unchanged 0.7 cm cystic focus in the right sella (14:90). Consider correlation with clinical findings/hormonal analysis. Right cerebellar DVA.  IMPRESSION:  Status post right frontoparietal craniotomy for mass resection with new areas of nodularity along the medial margin suspicious for recurrent/residual disease-attention on follow-up.  Unchanged cystic pituitary lesion.     COGNITION: Overall cognitive status: Within functional limits for tasks assessed   SENSATION: WFL  COORDINATION: Pt with good coordination in the LE's, reduced quickness with rapid hand turns, and reduced quickness with fingertip touches.  POSTURE: rounded shoulders and forward head  LOWER EXTREMITY MMT:    MMT Right Eval Left Eval  Hip flexion 5 5  Hip extension    Hip abduction    Hip adduction    Hip internal rotation     Hip external rotation    Knee flexion 5 4+  Knee extension 5 4+  Ankle dorsiflexion 5 4  Ankle plantarflexion    Ankle inversion    Ankle eversion    (Blank rows = not tested)  TRANSFERS: Sit to stand: Complete Independence  Assistive device utilized: None     Stand to sit: Complete Independence  Assistive device utilized: None     Chair to chair: Complete Independence  Assistive device utilized: None       RAMP:  Not tested  CURB:  Not tested  STAIRS: Findings: Level of Assistance: Complete Independence, Stair Negotiation Technique: Alternating Pattern  with Bilateral Rails, Number of Stairs: 4, Height of Stairs: 6   , and Comments: pt steady with ambulation, but slowed similar to walking style. GAIT: Findings: Distance walked: 40' and Comments: Pt with slowed gait pattern and reduce trunk rotation.  Pt has little to no arm swing as well and looks unsure/unsteady when ambulating.  FUNCTIONAL TESTS:  5 times sit to stand: 17.40 sec Timed up and go (TUG): 16.59 sec 6 minute walk test: TBD at next visit 10 meter walk test: 16.95 sec  Dynamic Gait Index: Dynamic Gait Index  Mark the lowest level that applies.   Date Performed 06/26/24  Gait level surface (2) Mild Impairment: Walks 20', uses AD, slower speed, mild gait deviations  2. Change in gait speed (1) Moderate Impairment: Makes only minor adjustments to walking speed, or accomplishes a change in speed with significant gait deviations, or changes speed but has significant gait deviations, or changes speed but loses balance but is able to recover and continue walking  3. Gait with horizontal head turns (1) Moderate Impairment: Performs head turns with moderate change in gait velocity, slows down, staggers but recovers, can continue to walk  4. Gait with vertical head turns (1) Moderate Impairment: Performs head turns with moderate change in gait velocity, slows down, staggers but recovers, can continue to walk  5. Gait and  pivot turn (2) Mild Impairment: Pivot turns safely in > 3 seconds and stops with no loss of balance  6. Step over obstacle (2) Mild Impairment: Is able to step over box, but must slow down and adjust steps to clear box safely  7. Step around obstacle (2) Mild Impairment: Is able to step around both cones, but must slow down and adjust steps to clear cones  8. Steps (2) Mild Impairment: Alternating feet, must use rail  Total score 13/24    Score Interpretation: Score of <19 indicates high risk of falls.  Minimally Clinically Important Difference (MCID):  =DGI scores of<21/24 = 1.80 points DGI scores of >21/24 = 0.60 points   [place] T, Inbar-Borovsky N, Brozgol M, Giladi N, [place] JM. The Dynamic Gait Index in healthy older adults: the role of stair climbing, fear of falling and gender. Gait Posture. 2009 Feb;29(2):237-41. doi: 10.1016/j.gaitpost.2008.08.013. Epub 2008 Oct 8. PMID: 81154560; PMCID: EFR7290501.  Pardasaney, MYRTIS LOIS Bonus, GEANNIE POUR., et al. (2012). Sensitivity to change and responsiveness of four balance measures for community-dwelling older adults. Physical therapy 92(3): 388-397.    BERG:  Item Test date: 07/02/24   Sitting to standing 4. able to stand without using hands and stabilize independently  2. Standing unsupported 4. able to stand safely for 2 minutes  3. Sitting with back unsupported, feet supported 4. able to sit safely and securely for 2 minutes  4. Standing to sitting 4. sits safely with minimal use of hands  5. Pivot transfer  4. able to transfer safely with minor use of hands  6. Standing unsupported with eyes closed 4. able to stand 10 seconds safely  7. Standing unsupported with feet together 4. able to place feet together independently and stand 1 minute safely  8. Reaching forward with outstretched arms while standing 4. can reach forward confidently 25 cm (10  inches)  9. Pick up object from the floor from standing 4. able to pick up slipper safely  and easily  10. Turning to look behind over left and right shoulders while standing 4. looks behind from both sides and weight shifts well  11. Turn 360 degrees 2. able to turn 360 degrees safely but slowly  12. Place alternate foot on step or stool while standing unsupported 4. ble to stand independently and safely and complete 8 steps in 20 seconds  13. Standing unsupported one foot in front 3. able to place foot ahead independently and hold 30 seconds  14. Standing on one leg 2. able to lift leg independently and hold >= 3 seconds    Total Score 51/56    PATIENT SURVEYS:  ABC scale: The Activities-Specific Balance Confidence (ABC) Scale 0% 10 20 30  40 50 60 70 80 90 100% No confidence<->completely confident  How confident are you that you will not lose your balance or become unsteady when you . . .   Date tested 06/26/24  Walk around the house 80%  2. Walk up or down stairs 80%  3. Bend over and pick up a slipper from in front of a closet floor 10%  4. Reach for a small can off a shelf at eye level 100%  5. Stand on tip toes and reach for something above your head 10%  6. Stand on a chair and reach for something 0%  7. Sweep the floor 80%  8. Walk outside the house to a car parked in the driveway 100%  9. Get into or out of a car 100%  10. Walk across a parking lot to the mall 100%  11. Walk up or down a ramp 100%  12. Walk in a crowded mall where people rapidly walk past you 100%  13. Are bumped into by people as you walk through the mall 100%  14. Step onto or off of an escalator while you are holding onto the railing 100%  15. Step onto or off an escalator while holding onto parcels such that you cannot hold onto the railing 100%  16. Walk outside on icy sidewalks 60%  Total: #/16 76.25%                                                                                                                                 TREATMENT DATE: 08/20/24  Gait Belt with SBA used  throughout entirety of today's treatment session for increased safety.    Gait 900' with SBA for safety with dual task conversing about random topics.   Sit to stand 4x10.   Walking tandem on 2x4 in // bars x5 laps.   Single leg stance in // bars 3x30'' each. (More difficulty on R LE compared to L).   Step-ups x15 each LE on 6'' step without UE support.  Gait 900' with SBA for safety with dual task working.  PATIENT EDUCATION: Education details: Pt. Educated on importance of safety with gait and reasoning for performing gait with dual tasks today.    Person educated: Patient and   Education method: Explanation Education comprehension: verbalized understanding  HOME EXERCISE PROGRAM: Access Code: [email or web address] URL: [email or web address] Date: 06/26/2024 Prepared by: Sidra Simpers  Exercises - Standing Hip Abduction with Counter Support  - 1 x daily - 3-4 x weekly - 3 sets - 10 reps - Standing Hip Extension with Counter Support  - 1 x daily - 3-4 x weekly - 3 sets - 10 reps - Standing Knee Flexion with Counter Support  - 1 x daily - 7 x weekly - 3 sets - 10 reps - Standing March with Counter Support  - 1 x daily - 3-4 x weekly - 3 sets - 10 reps - Heel Toe Raises with Unilateral Counter Support  - 1 x daily - 7 x weekly - 3 sets - 10 reps  GOALS: Goals reviewed with patient? Yes  SHORT TERM GOALS: Target date: 07/24/2024  Pt will be independent with HEP in order to demonstrate increased ability to perform tasks related to occupation/hobbies. Baseline: Pt given HEP today and encouraged to perform with reps and sets on the page. Goal status: INITIAL  LONG TERM GOALS: Target date: 09/18/2024  1.  Patient (> 40 years old) will complete five times sit to stand test in < 15 seconds indicating an increased LE strength and improved balance. Baseline: 17.40 sec Goal status: INITIAL  2.  Pt will improve DGI by at least 3 points in order to demonstrate clinically  significant improvement in balance and decreased risk for falls Baseline: 13/24 Goal status: INITIAL   3.  Pt will improve ABC by at least 13% in order to demonstrate clinically significant improvement in balance confidence. Baseline: 76.25% (pt's daughter states this number is elevated) Goal status: INITIAL   4.  Patient will reduce timed up and go to <11 seconds to reduce fall risk and demonstrate improved transfer/gait ability. Baseline: 16.59 sec Goal status: INITIAL  5.  Patient will increase 10 meter walk test to >1.27m/s as to improve gait speed for better community ambulation and to reduce fall risk. Baseline: 16.95 Goal status: INITIAL  6.  Patient will increase six minute walk test distance to >1000 for progression to community ambulator and improve gait ability Baseline: 563' Goal status: INITIAL   ASSESSMENT:  CLINICAL IMPRESSION:  Patient began with walking 900' with dual task for improved gait speed and cardiovasular response. Cuing required for increased gait speed and step length. He then performed sit to stands for increased activity tolerance and improved efficiency with transfers at home. He also performed dynamic balance activities with tandem walk on 2x4. He was noted to have more difficulty standing on R LE compared to L during SLS exercise today. He finished tx. With a second 900' gait trial and was noted to have decreased gait speed compared to first trial requiring intermittent cuing to increase gait speed. Patient continues to have decreased activity tolerance with SHOB and decreased gait speed with prolonged walking. He also is noted to have slight balance deficits when performing balance exercises during today's treatment session. Patient will continue to benefit from skilled PT at this time as decreased balance, endurance, and gait abnormalities are still noted throughout session.      OBJECTIVE IMPAIRMENTS: Abnormal gait, decreased activity tolerance,  decreased balance, decreased cognition, decreased coordination, decreased endurance, decreased knowledge of condition, decreased mobility, difficulty walking,  decreased strength, and obesity.   ACTIVITY LIMITATIONS: carrying, lifting, bending, squatting, stairs, and locomotion level  PARTICIPATION LIMITATIONS: cleaning, laundry, driving, shopping, community activity, and occupation  PERSONAL FACTORS: Age, Fitness, Past/current experiences, Profession, Time since onset of injury/illness/exacerbation, and 3+ comorbidities: Hx of cancer, DM II, HTN, sleep apnea are also affecting patient's functional outcome.   REHAB POTENTIAL: Good  CLINICAL DECISION MAKING: Evolving/moderate complexity  EVALUATION COMPLEXITY: Moderate  PLAN:  PT FREQUENCY: 1-2x/week  PT DURATION: 12 weeks  PLANNED INTERVENTIONS: 97750- Physical Performance Testing, 97110-Therapeutic exercises, 97530- Therapeutic activity, [id number]- Neuromuscular re-education, 97535- Self Care, [id number]- Manual therapy, [phone]- Gait training, [phone]- Canalith repositioning, Patient/Family education, Balance training, Stair training, Vestibular training, Visual/preceptual remediation/compensation, Cryotherapy, and Moist heat  PLAN FOR NEXT SESSION:   Continue working to increased sustained attention, gait speed, and endurance in upcoming sessions. Continue working to improve higher level dynamic balance.     Norman KATHEE Sharps PT ,DPT Physical Therapist- [facility] 08/20/24, 11:21 AM

## 2024-08-22 ENCOUNTER — Ambulatory Visit

## 2024-08-22 ENCOUNTER — Ambulatory Visit: Admitting: Speech Pathology

## 2024-08-22 DIAGNOSIS — R41841 Cognitive communication deficit: Secondary | ICD-10-CM

## 2024-08-22 DIAGNOSIS — R2689 Other abnormalities of gait and mobility: Secondary | ICD-10-CM

## 2024-08-22 DIAGNOSIS — R278 Other lack of coordination: Secondary | ICD-10-CM

## 2024-08-22 DIAGNOSIS — M6281 Muscle weakness (generalized): Secondary | ICD-10-CM | POA: Diagnosis not present

## 2024-08-22 NOTE — Therapy (Signed)
 OUTPATIENT PHYSICAL THERAPY NEURO TREATMENT Physical Therapy Progress Note/Discharge   Dates of reporting period  06/26/2024   to   08/22/2024   Patient Name: Sergio Kennedy MRN: [id number] DOB:05/09/1956, 68 y.o., male Today's Date: 08/22/2024   PCP: Epifanio Alm SQUIBB, MD  REFERRING PROVIDER: Zettie Rosaria HERO, MD   END OF SESSION:   PT End of Session - 08/22/24 0903     Visit Number 10    Number of Visits 25    Date for Recertification  09/18/24    PT Start Time 0918    PT Stop Time 1008    PT Time Calculation (min) 50 min    Equipment Utilized During Treatment Gait belt    Activity Tolerance Patient tolerated treatment well             Past Medical History:  Diagnosis Date   Cancer (HCC)    Class 1 obesity due to excess calories with serious comorbidity and body mass index (BMI) of 34.0 to 34.9 in adult    Diabetes mellitus without complication (HCC)    History of kidney stones    HTN (hypertension), benign    Hyperlipidemia    Hypertension    Hypothyroidism    Hypothyroidism, unspecified    Irregular heart rhythm    Malignant melanoma, unspecified site (HCC)    Pre-diabetes    Sleep apnea    Thyroid disease    Past Surgical History:  Procedure Laterality Date   BACK SURGERY  01/2004   COLONOSCOPY N/A 02/15/2024   Procedure: COLONOSCOPY;  Surgeon: Onita Elspeth Sharper, DO;  Location: [facility] ENDOSCOPY;  Service: Gastroenterology;  Laterality: N/A;   EXCISION MASS NECK N/A 04/30/2024   Procedure: EXCISION, MASS, NECK;  Surgeon: Rodolph Romano, MD;  Location: [facility] ORS;  Service: General;  Laterality: N/A;   FOOT SURGERY     melanoma and lymph nodes removed  2023   on back side of the neck   POLYPECTOMY  02/15/2024   Procedure: POLYPECTOMY, INTESTINE;  Surgeon: Onita Elspeth Sharper, DO;  Location: [facility] ENDOSCOPY;  Service: Gastroenterology;;   Patient Active Problem List   Diagnosis Date Noted   Obstructive sleep apnea 05/23/2018    Chronic atrial fibrillation (HCC) 05/23/2018    ONSET DATE: 06/16/24  REFERRING DIAG:  G93.9 (ICD-10-CM) - Disorder of brain, unspecified  C43.9 (ICD-10-CM) - Metastatic melanoma (HCC)    THERAPY DIAG:  Muscle weakness (generalized)  Other lack of coordination  Imbalance  Rationale for Evaluation and Treatment: Rehabilitation  SUBJECTIVE:  SUBJECTIVE STATEMENT: 08/22/2024 Patient reports he feels alright today. He was accompanied by a friend that brought him. Patient reports he still has a little pain in R side of chest where his port was placed. Pt reports no changes since last time I saw him.   PERTINENT HISTORY: Pt has hx of cancer and tumor removal via craniectomy on 06/16/24.  Pt does have DM, HTN, sleep apnea, and irregular heat rhythm.  PAIN:  Are you having pain? No  PRECAUTIONS: Other: No bending over, no heavy lifting (>10#), and reduce twisting in the neck.  RED FLAGS: None   WEIGHT BEARING RESTRICTIONS: No  FALLS: Has patient fallen in last 6 months? No  LIVING ENVIRONMENT: Lives with: lives with their daughter until pt is recovered. Lives in: House/apartment Stairs: One step entry Has following equipment at home: Walking stick.  PLOF: Independent  PATIENT GOALS: Be able to improve strength back to baseline levels.    OBJECTIVE: Note: Objective measures were completed at Evaluation unless otherwise noted.  DIAGNOSTIC FINDINGS:   EXAM: CT CERVICAL SPINE WITHOUT CONTRAST  IMPRESSION: No recent fracture is seen in cervical spine. 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  From radiology note 07/08/24: EXAM: Magnetic resonance imaging, brain, without and with contrast material. 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 contrast.  FINDINGS: Postsurgical changes right frontoparietal craniotomy for mass resection. There is hemorrhage in the resection cavity, in the right basal ganglia, and extending into the right lateral ventricle, overall decreased since prior. There are a few nodular foci of enhancement along the medial margin of the resection cavity (16:122, 16:119, 16:116), more conspicuous since prior. Similar vasogenic edema in the right frontal lobe. Increased T2/FLAIR hyperintense signal in the right basal ganglia (5:16), likely vasogenic edema. Small amount of hemorrhage layering in the left occipital horn, decreased since prior. There is overall decreased blood products throughout the remaining ventricles. There is also hemosiderin deposition along the left basilar cistern. Decreased size of evolving bilateral subdural collections, right greater than left.  Ex-vacuo dilation of the ventricles, similar to prior. Decreased pneumocephalus. There is no midline shift.  Unchanged 0.7 cm cystic focus in the right sella (14:90). Consider correlation with clinical findings/hormonal analysis. Right cerebellar DVA.  IMPRESSION:  Status post right frontoparietal craniotomy for mass resection with new areas of nodularity along the medial margin suspicious for recurrent/residual disease-attention on follow-up.  Unchanged cystic pituitary lesion.     COGNITION: Overall cognitive status: Within functional limits for tasks assessed   SENSATION: WFL  COORDINATION: Pt with good coordination in the LE's, reduced quickness with rapid hand turns, and reduced quickness with fingertip touches.  POSTURE: rounded shoulders and forward head  LOWER EXTREMITY MMT:    MMT  Right Eval Left Eval  Hip flexion 5 5  Hip extension    Hip abduction    Hip adduction    Hip internal rotation    Hip external rotation    Knee flexion 5 4+  Knee extension 5 4+  Ankle dorsiflexion 5 4  Ankle plantarflexion    Ankle inversion    Ankle eversion    (Blank rows = not tested)  TRANSFERS: Sit to stand: Complete Independence  Assistive device utilized: None     Stand  to sit: Complete Independence  Assistive device utilized: None     Chair to chair: Complete Independence  Assistive device utilized: None       RAMP:  Not tested  CURB:  Not tested  STAIRS: Findings: Level of Assistance: Complete Independence, Stair Negotiation Technique: Alternating Pattern  with Bilateral Rails, Number of Stairs: 4, Height of Stairs: 6   , and Comments: pt steady with ambulation, but slowed similar to walking style. GAIT: Findings: Distance walked: 35' and Comments: Pt with slowed gait pattern and reduce trunk rotation.  Pt has little to no arm swing as well and looks unsure/unsteady when ambulating.  FUNCTIONAL TESTS:  5 times sit to stand: 17.40 sec Timed up and go (TUG): 16.59 sec 6 minute walk test: TBD at next visit 10 meter walk test: 16.95 sec  Dynamic Gait Index: Dynamic Gait Index  Mark the lowest level that applies.   Date Performed 06/26/24  Gait level surface (2) Mild Impairment: Walks 20', uses AD, slower speed, mild gait deviations  2. Change in gait speed (1) Moderate Impairment: Makes only minor adjustments to walking speed, or accomplishes a change in speed with significant gait deviations, or changes speed but has significant gait deviations, or changes speed but loses balance but is able to recover and continue walking  3. Gait with horizontal head turns (1) Moderate Impairment: Performs head turns with moderate change in gait velocity, slows down, staggers but recovers, can continue to walk  4. Gait with vertical head turns (1) Moderate Impairment: Performs  head turns with moderate change in gait velocity, slows down, staggers but recovers, can continue to walk  5. Gait and pivot turn (2) Mild Impairment: Pivot turns safely in > 3 seconds and stops with no loss of balance  6. Step over obstacle (2) Mild Impairment: Is able to step over box, but must slow down and adjust steps to clear box safely  7. Step around obstacle (2) Mild Impairment: Is able to step around both cones, but must slow down and adjust steps to clear cones  8. Steps (2) Mild Impairment: Alternating feet, must use rail  Total score 13/24    Score Interpretation: Score of <19 indicates high risk of falls.  Minimally Clinically Important Difference (MCID):  =DGI scores of<21/24 = 1.80 points DGI scores of >21/24 = 0.60 points   [place] T, Inbar-Borovsky N, Brozgol M, Giladi N, [place] JM. The Dynamic Gait Index in healthy older adults: the role of stair climbing, fear of falling and gender. Gait Posture. 2009 Feb;29(2):237-41. doi: 10.1016/j.gaitpost.2008.08.013. Epub 2008 Oct 8. PMID: 81154560; PMCID: EFR7290501.  Pardasaney, MYRTIS LOIS Bonus, GEANNIE POUR., et al. (2012). Sensitivity to change and responsiveness of four balance measures for community-dwelling older adults. Physical therapy 92(3): 388-397.    BERG:  Item Test date: 07/02/24   Sitting to standing 4. able to stand without using hands and stabilize independently  2. Standing unsupported 4. able to stand safely for 2 minutes  3. Sitting with back unsupported, feet supported 4. able to sit safely and securely for 2 minutes  4. Standing to sitting 4. sits safely with minimal use of hands  5. Pivot transfer  4. able to transfer safely with minor use of hands  6. Standing unsupported with eyes closed 4. able to stand 10 seconds safely  7. Standing unsupported with feet together 4. able to place feet together independently and stand 1 minute safely  8. Reaching forward with outstretched arms while standing 4. can reach  forward confidently 25 cm (10 inches)  9. Pick up object from the floor from standing 4. able to pick up slipper safely and easily  10. Turning to look behind over left and right shoulders while standing 4. looks behind from both sides and weight shifts well  11. Turn 360 degrees 2. able to turn 360 degrees safely but slowly  12. Place alternate foot on step or stool while standing unsupported 4. ble to stand independently and safely and complete 8 steps in 20 seconds  13. Standing unsupported one foot in front 3. able to place foot ahead independently and hold 30 seconds  14. Standing on one leg 2. able to lift leg independently and hold >= 3 seconds    Total Score 51/56    PATIENT SURVEYS:  ABC scale: The Activities-Specific Balance Confidence (ABC) Scale 0% 10 20 30  40 50 60 70 80 90 100% No confidence<->completely confident  How confident are you that you will not lose your balance or become unsteady when you . . .   Date tested 06/26/24  Walk around the house 80%  2. Walk up or down stairs 80%  3. Bend over and pick up a slipper from in front of a closet floor 10%  4. Reach for a small can off a shelf at eye level 100%  5. Stand on tip toes and reach for something above your head 10%  6. Stand on a chair and reach for something 0%  7. Sweep the floor 80%  8. Walk outside the house to a car parked in the driveway 100%  9. Get into or out of a car 100%  10. Walk across a parking lot to the mall 100%  11. Walk up or down a ramp 100%  12. Walk in a crowded mall where people rapidly walk past you 100%  13. Are bumped into by people as you walk through the mall 100%  14. Step onto or off of an escalator while you are holding onto the railing 100%  15. Step onto or off an escalator while holding onto parcels such that you cannot hold onto the railing 100%  16. Walk outside on icy sidewalks 60%  Total: #/16 76.25%                                                                                                                                  TREATMENT DATE: 08/22/2024  Gait Belt with SBA used throughout entirety of today's treatment session for increased safety.   PN performed today: See goals sections and Assessment.   MMT L LE: 5/5 MMT.  Standing in Romberg: no difficulty.  Standing with EC: No difficulty.  Tandem: 30'' without LOB.  Picking up item off floor indepedently without diffiuclty.    PATIENT EDUCATION: Education details: Pt. Educated on importance of safety with gait and reasoning for performing gait with dual tasks today.    Person educated:  Patient and   Education method: Explanation Education comprehension: verbalized understanding  HOME EXERCISE PROGRAM: Access Code: [email or web address] URL: [email or web address] Date: 06/26/2024 Prepared by: Sidra Simpers  Exercises - Standing Hip Abduction with Counter Support  - 1 x daily - 3-4 x weekly - 3 sets - 10 reps - Standing Hip Extension with Counter Support  - 1 x daily - 3-4 x weekly - 3 sets - 10 reps - Standing Knee Flexion with Counter Support  - 1 x daily - 7 x weekly - 3 sets - 10 reps - Standing March with Counter Support  - 1 x daily - 3-4 x weekly - 3 sets - 10 reps - Heel Toe Raises with Unilateral Counter Support  - 1 x daily - 7 x weekly - 3 sets - 10 reps  GOALS: Goals reviewed with patient? Yes  SHORT TERM GOALS: Target date: 07/24/2024  Pt will be independent with HEP in order to demonstrate increased ability to perform tasks related to occupation/hobbies. Baseline: Pt given HEP today and encouraged to perform with reps and sets on the page. Goal status: INITIAL  LONG TERM GOALS: Target date: 09/18/2024  1.  Patient (> 26 years old) will complete five times sit to stand test in < 15 seconds indicating an increased LE strength and improved balance. Baseline: 17.40 sec Goal status: 08/22/2024: Met: 11.89''   2.  Pt will improve DGI by at least 3 points in order to  demonstrate clinically significant improvement in balance and decreased risk for falls Baseline: 13/24 Goal status: 08/22/2024: Met: 23/24 scored.    3.  Pt will improve ABC by at least 13% in order to demonstrate clinically significant improvement in balance confidence. Baseline: 76.25% (pt's daughter states this number is elevated) Goal status: 08/22/2024: 100%   4.  Patient will reduce timed up and go to <11 seconds to reduce fall risk and demonstrate improved transfer/gait ability. Baseline: 16.59 sec Goal status: 08/22/2024: Met: 10.34''   5.  Patient will increase 10 meter walk test to >1.13m/s as to improve gait speed for better community ambulation and to reduce fall risk. Baseline: 16.95 Goal status: 08/22/2024: Met: 1.11 m/s without AD.  6.  Patient will increase six minute walk test distance to >1000 for progression to community ambulator and improve gait ability Baseline: 563' Goal status: 08/22/2024: Met: 1,100'    ASSESSMENT:  CLINICAL IMPRESSION:  Today is patient's 10th visit therefore a progress note was taken.   Subjectively, patient is doing well today. He reports he is still living with his daughter, but plans to return home eventually once he is safe to do so. Patient does live alone. Patient reports that his strength in his legs has improved since has began PT treatments. He reports that he feels like he can walk faster, but unsure if he can walk further. He reports his balance is good and he does not worry about falling. He reports he has no concerns with mobility at home at this time and does not have difficulty performing anything related to mobility.   Objectively, patient has made significant improvement in the past 10 visits as noted today through re-assessment of his goals. Patient demonstrated improved sit to stand speed with 5 sit to stands in 11.89'' meeting STS goal. Improved gait speed, gait endurance, balance with gait, and gait mechanics were noted  during the 6 minute walk test, 10 minute walk test, TUG test, and DGI test as he made signficant improvement in all of these tests  meeting his goals. Patient reported 100% confidence level on the ABC outcome measure. He was able to demonstrate no difficulty maintaining balance with romberg stance or EC. He was able to hold tandem stance for greater than 30'' and showed no deficits with toe tapping on 6'' step today.   Overall, patient met all goals today demonstrating improved LE strength, improved balance, and decreased fall risk. Patient and I discussed his POC and I recommended that he continue his HEP on his own at this time due to goals met, lack of deficits noted, and patient's ability/confidence in continuing on his own. Patient will be discharged at this time.      OBJECTIVE IMPAIRMENTS: Abnormal gait, decreased activity tolerance, decreased balance, decreased cognition, decreased coordination, decreased endurance, decreased knowledge of condition, decreased mobility, difficulty walking, decreased strength, and obesity.   ACTIVITY LIMITATIONS: carrying, lifting, bending, squatting, stairs, and locomotion level  PARTICIPATION LIMITATIONS: cleaning, laundry, driving, shopping, community activity, and occupation  PERSONAL FACTORS: Age, Fitness, Past/current experiences, Profession, Time since onset of injury/illness/exacerbation, and 3+ comorbidities: Hx of cancer, DM II, HTN, sleep apnea are also affecting patient's functional outcome.   REHAB POTENTIAL: Good  CLINICAL DECISION MAKING: Evolving/moderate complexity  EVALUATION COMPLEXITY: Moderate  PLAN:  PT FREQUENCY: 1-2x/week  PT DURATION: 12 weeks  PLANNED INTERVENTIONS: 97750- Physical Performance Testing, 97110-Therapeutic exercises, 97530- Therapeutic activity, [id number]- Neuromuscular re-education, 97535- Self Care, [id number]- Manual therapy, [phone]- Gait training, [phone]- Canalith repositioning, Patient/Family education, Balance  training, Stair training, Vestibular training, Visual/preceptual remediation/compensation, Cryotherapy, and Moist heat  PLAN FOR NEXT SESSION:   Continue working to increased sustained attention, gait speed, and endurance in upcoming sessions. Continue working to improve higher level dynamic balance.     Norman KATHEE Sharps PT ,DPT Physical Therapist- [facility] 08/22/2024, 10:11 AM

## 2024-08-22 NOTE — Therapy (Signed)
 OUTPATIENT SPEECH LANGUAGE PATHOLOGY  COGNITION TREATMENT NOTE   Patient Name: Sergio Kennedy MRN: [id number] DOB:[birth year], 68 y.o., male Today's Date: 08/22/2024   PCP: Alm Needle, MD REFERRING PROVIDER: Rosaria CHRISTELLA Lush, MD   End of Session - 08/22/24 1508     Visit Number 9    Number of Visits 25    Date for Recertification  09/24/24    Authorization Type [employer] HMO PPO    Progress Note Due on Visit 10    SLP Start Time 1010    SLP Stop Time  1055    SLP Time Calculation (min) 45 min    Activity Tolerance Patient tolerated treatment well            Past Medical History:  Diagnosis Date   Cancer (HCC)    Class 1 obesity due to excess calories with serious comorbidity and body mass index (BMI) of 34.0 to 34.9 in adult    Diabetes mellitus without complication (HCC)    History of kidney stones    HTN (hypertension), benign    Hyperlipidemia    Hypertension    Hypothyroidism    Hypothyroidism, unspecified    Irregular heart rhythm    Malignant melanoma, unspecified site [facility])    Pre-diabetes    Sleep apnea    Thyroid disease    Past Surgical History:  Procedure Laterality Date   BACK SURGERY  01/2004   COLONOSCOPY N/A 02/15/2024   Procedure: COLONOSCOPY;  Surgeon: Onita Elspeth Sharper, DO;  Location: [facility] ENDOSCOPY;  Service: Gastroenterology;  Laterality: N/A;   EXCISION MASS NECK N/A 04/30/2024   Procedure: EXCISION, MASS, NECK;  Surgeon: Rodolph Romano, MD;  Location: [facility] ORS;  Service: General;  Laterality: N/A;   FOOT SURGERY     melanoma and lymph nodes removed  2023   on back side of the neck   POLYPECTOMY  02/15/2024   Procedure: POLYPECTOMY, INTESTINE;  Surgeon: Onita Elspeth Sharper, DO;  Location: [facility] ENDOSCOPY;  Service: Gastroenterology;;   Patient Active Problem List   Diagnosis Date Noted   Obstructive sleep apnea 05/23/2018   Chronic atrial fibrillation (HCC) 05/23/2018    ONSET DATE: 06/16/2024    REFERRING DIAG:  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 Pt had right craniotomy for tumor resection on 06/16/2024.   Medical Tests / Procedures Comments: MRI 10/6: Postoperative changes of right frontoparietal craniotomy for mass resection with postoperative blood products within the resection cavity and surgical tract.    Layering blood products in the supratentorial and infratentorial ventricular system without hydrocephalus.    Mild linear and nodular enhancement along the cavity margins, nonspecific, could reflect postsurgical inflammatory changes. Attention on follow-up.    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  Pt with Cognitive Linguistic Evaluation while admitted at [facility] on 06/17/2024. Per chart, pt tested [facility] across cognitive-linguistic domains assessed during testing.  07/04/2024 Cycle #1 ipilimumab 3mg /kg + nivolumab 1mg /kg immunotherapy  MRI 07/08/2024 Postsurgical changes right frontoparietal craniotomy for mass resection. There is hemorrhage in the resection cavity, in the right basal ganglia, and extending into the right lateral ventricle, overall decreased since prior. There are a few nodular foci of  enhancement along the medial margin of the resection cavity (16:122, 16:119, 16:116), more conspicuous since prior. Similar vasogenic edema in the right frontal lobe. Increased T2/FLAIR hyperintense signal in the right basal ganglia (5:16), likely vasogenic edema. Small amount of hemorrhage layering in the left occipital horn, decreased since prior. There is overall decreased blood products throughout the  remaining ventricles. There is also hemosiderin deposition along the left basilar cistern. Decreased size of evolving bilateral subdural collections, right greater than left.  07/12/2024 thru 07/16/2024 admitted for grade 4 dermatitis secondary to ipilimumab/nivomumab immunotherapy  07/28/2024 Completion of fifth fraction of SBRT using CyberKnife.  Treatment Site: Brain Tumor Volume Fx dose: 600 cGy Total dose thus far received: 3000/3000 cGy   08/16/2024 cycle #2 ipilimumab 3mg /kg + nivolumab 1mg /kg   PAIN:  Are you having pain? No   FALLS: Has patient fallen in last 6 months?  See PT evaluation for details  LIVING ENVIRONMENT: Lives with: lives with their daughter Lives in: House/apartment  PLOF:  Level of assistance: Independent with ADLs, Independent with IADLs Employment: Full-time employment   PATIENT GOALS   to improve cognitive function for return to independent living, driving and working  SUBJECTIVE STATEMENT: Pt eager, pt accompanied by friend, reports his daughter is out of town Pt accompanied by: self, friend  OBJECTIVE:   TODAY'S TREATMENT:  Skilled treatment session targeted pt's cognitive communication goals. SLP facilitated session by providing the following interventions:  Pt able to complete working/memory/mental flexibility task with moderate verbal cues to achieve 75% accuracy.  He completed basic tangrams with 75% accuracy given moderate verbal and visual cues.   PATIENT EDUCATION: Education details: cognitive communication, personality and left inattention/visual field deficits with site of lesion Person educated: Patient and Child(ren) Education method: Medical Illustrator Education comprehension: verbalized understanding and needs further education   HOME EXERCISE PROGRAM:  Recommend pt begin using a calendar for recall of appts   GOALS:  Goals reviewed with patient? Yes  SHORT TERM GOALS: Target date: 10 sessions  With  Moderate A to utilize finger scanning technique to attend to the left most side of the page, patient will read page-level information from a novel with 50% accuracy.  Baseline: Goal status: INITIAL  2.  With moderate A, pt will complete complex medication management task with 75% accuracy.  Baseline:  Goal status: INITIAL  3.  With moderate A, pt will complex money management task with 75& accuracy.  Baseline:  Goal status: INITIAL  4.  With moderate A, pt will demonstrate emergent awareness by self-monitoring and self-correcting errors in 90% of opportunities.  Baseline:  Goal status: INITIAL   LONG TERM GOALS: Target date: 09/24/2024  With Supervision A, patient will demonstrate ability to complete complex reasoning tasks with 75% accuracy.  Baseline:  Goal status: INITIAL  2.  With Min A, pt will demonstrate anticipatory awareness by providing solution for hypothetical safety situation in 5 out of 7 opportunities.  Baseline:  Goal status: INITIAL  3.  Pt will report improved cognitive communication via PROM by 5 points at last ST session   Baseline:  Goal status: INITIAL   ASSESSMENT:  CLINICAL IMPRESSION: Patient is a 68 y.o. male  who was seen today for a cognitive communication treatment session d/t recent frontoparietal craniotomy for mass resection. He presents with moderate cognitive impairment c/b severe deficits in memory (short term, working memory, prospective memory), attention (selective and left sided), higher level problem solving, reasoning, executive functions and emergent awareness.   Pt  continues to present with a moderate right hemisphere disorder. See the above treatment note for details.   OBJECTIVE IMPAIRMENTS include attention, memory, awareness, and executive functioning. These impairments are limiting patient from return to work, managing medications, managing appointments, managing finances, household responsibilities, ADLs/IADLs, and effectively  communicating at home and in community. Factors affecting potential to achieve goals and functional outcome are medical prognosis, severity of impairments, and future radiation and infusions. Patient will benefit from skilled SLP services to address above impairments and improve overall function.  REHAB POTENTIAL: Good  PLAN: SLP FREQUENCY: 1-2x/week  SLP DURATION: 12 weeks  PLANNED INTERVENTIONS: Environmental controls, Cueing hierachy, Cognitive reorganization, Internal/external aids, Functional tasks, SLP instruction and feedback, Compensatory strategies, and Patient/family education    Rodrick Payson B. Rubbie, M.S., CCC-SLP, Tree Surgeon Certified Brain Injury Specialist [facility]  [facility] Rehabilitation Services Office [phone] Ascom [phone] Fax [phone]

## 2024-08-27 ENCOUNTER — Ambulatory Visit: Admitting: Speech Pathology

## 2024-08-27 ENCOUNTER — Ambulatory Visit

## 2024-08-27 DIAGNOSIS — R41841 Cognitive communication deficit: Secondary | ICD-10-CM

## 2024-08-27 DIAGNOSIS — M6281 Muscle weakness (generalized): Secondary | ICD-10-CM | POA: Diagnosis not present

## 2024-08-27 NOTE — Therapy (Signed)
 OUTPATIENT SPEECH LANGUAGE PATHOLOGY  COGNITION TREATMENT NOTE 10th VISIT PROGRESS NOTE   Patient Name: Sergio Kennedy MRN: [id number] DOB:11-06-1955, 68 y.o., male Today's Date: 08/27/2024  Speech Therapy Progress Note  Dates of Reporting Period: 07/02/2024 to 08/27/2024  Objective: Patient has been seen for 10 speech therapy sessions this reporting period targeting pt's moderate right hemisphere disorder. Patient is slow steady progress towards STG and LTGs. Pt missed several sessions d/t hospitalization and oncology appts. See skilled intervention, clinical impressions, and goals below for details.    PCP: Alm Needle, MD REFERRING PROVIDER: Rosaria CHRISTELLA Lush, MD   End of Session - 08/27/24 0848     Visit Number 10    Number of Visits 25    Date for Recertification  09/24/24    Authorization Type [employer] Medicare HMO PPO    Progress Note Due on Visit 10    SLP Start Time 0845    SLP Stop Time  0930    SLP Time Calculation (min) 45 min    Activity Tolerance Patient tolerated treatment well            Past Medical History:  Diagnosis Date   Cancer (HCC)    Class 1 obesity due to excess calories with serious comorbidity and body mass index (BMI) of 34.0 to 34.9 in adult    Diabetes mellitus without complication (HCC)    History of kidney stones    HTN (hypertension), benign    Hyperlipidemia    Hypertension    Hypothyroidism    Hypothyroidism, unspecified    Irregular heart rhythm    Malignant melanoma, unspecified site [facility])    Pre-diabetes    Sleep apnea    Thyroid disease    Past Surgical History:  Procedure Laterality Date   BACK SURGERY  01/2004   COLONOSCOPY N/A 02/15/2024   Procedure: COLONOSCOPY;  Surgeon: Onita Elspeth Sharper, DO;  Location: [facility] ENDOSCOPY;  Service: Gastroenterology;  Laterality: N/A;   EXCISION MASS NECK N/A 04/30/2024   Procedure: EXCISION, MASS, NECK;  Surgeon: Rodolph Romano, MD;  Location: [facility] ORS;  Service:  General;  Laterality: N/A;   FOOT SURGERY     melanoma and lymph nodes removed  2023   on back side of the neck   POLYPECTOMY  02/15/2024   Procedure: POLYPECTOMY, INTESTINE;  Surgeon: Onita Elspeth Sharper, DO;  Location: [facility] ENDOSCOPY;  Service: Gastroenterology;;   Patient Active Problem List   Diagnosis Date Noted   Obstructive sleep apnea 05/23/2018   Chronic atrial fibrillation (HCC) 05/23/2018    ONSET DATE: 06/16/2024   REFERRING DIAG:  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 Pt had right craniotomy for tumor resection on 06/16/2024.   Medical Tests / Procedures Comments: MRI 10/6: Postoperative changes of right frontoparietal craniotomy for mass resection with postoperative blood products within the resection cavity and surgical tract.    Layering blood products in the supratentorial and infratentorial ventricular system without hydrocephalus.    Mild linear and nodular enhancement along the cavity margins, nonspecific, could reflect postsurgical inflammatory changes. Attention on follow-up.    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  Pt with Cognitive Linguistic Evaluation while admitted  at [facility] on 06/17/2024. Per chart, pt tested [facility] across cognitive-linguistic domains assessed during testing.  07/04/2024 Cycle #1 ipilimumab 3mg /kg + nivolumab 1mg /kg immunotherapy  MRI 07/08/2024 Postsurgical changes right frontoparietal craniotomy for mass resection. There is hemorrhage in the resection cavity, in the right basal ganglia, and extending into the right lateral ventricle, overall decreased since  prior. There are a few nodular foci of enhancement along the medial margin of the resection cavity (16:122, 16:119, 16:116), more conspicuous since prior. Similar vasogenic edema in the right frontal lobe. Increased T2/FLAIR hyperintense signal in the right basal ganglia (5:16), likely vasogenic edema. Small amount of hemorrhage layering in the left occipital horn, decreased since prior. There is overall decreased blood products throughout the remaining ventricles. There is also hemosiderin deposition along the left basilar cistern. Decreased size of evolving bilateral subdural collections, right greater than left.  07/12/2024 thru 07/16/2024 admitted for grade 4 dermatitis secondary to ipilimumab/nivomumab immunotherapy  07/28/2024 Completion of fifth fraction of SBRT using CyberKnife.  Treatment Site: Brain Tumor Volume Fx dose: 600 cGy Total dose thus far received: 3000/3000 cGy   08/16/2024 cycle #2 ipilimumab 3mg /kg + nivolumab 1mg /kg   PAIN:  Are you having pain? No   FALLS: Has patient fallen in last 6 months?  See PT evaluation for details  LIVING ENVIRONMENT: Lives with: lives with their daughter Lives in: House/apartment  PLOF:  Level of assistance: Independent with ADLs, Independent with IADLs Employment: Full-time employment   PATIENT GOALS   to improve cognitive function for return to independent living, driving and working  SUBJECTIVE STATEMENT: Pt eager, pt accompanied by friend, reports that his daughter wasn't able to go out of town but is currently sick Pt accompanied by: self, friend Chryl)  OBJECTIVE:   TODAY'S TREATMENT:  Skilled treatment session targeted pt's cognitive communication goals. SLP facilitated session by providing the following interventions:  Pt arrives with his friend Chryl) - pt states that he has moved back to his house with cameras in place and his daughter has provided support/reminders (pt shows this statistician) of the lists etc. Pt  reports success.   Pt benefited from moderate A during mental memory task.  TRAIL MAKING TEST (TMT) Parts A & B Both parts of the Trail Making Test consist of 25 circles distributed over a sheet of paper. In Part A, the circles are numbered 1 - 25, and the patient should draw lines to connect the numbers in ascending order. In Part B, the circles include both numbers (1 - 13) and letters (A - L); as in Part A, the patient draws lines to connect the circles in an ascending pattern, but with the added task of alternating between the numbers and letters (i.e., 1-A-2-B-3-C, etc.).   Results for both TMT A and B are reported as the number of seconds required to complete the task; therefore, higher scores reveal greater impairment.  Results:  Trail A - DEFICIENT - 172 seconds (n=29 seconds; Deficient > 78 seconds) - difficulty noted with left scanning and extended time for termination of task at END - previous result (07/09/2024) 189 seconds Trail B - DEFICIENT - 407 seconds (n= 75 seconds; Deficient > 273 seconds) - difficulty noted with left scanning, working memory and alternating attention: previous results (07/09/2024) 409 seconds  TMTs have been shown to be significantly correlated with impaired driving on road tests by older drivers. TMT-A provided the best utility for determining a range of scores (68-90 sec) for which additional road testing would  be indicated in general practice settings.(Papandonatos GD, Layla CAVES, [address], [place], [place] DB. Clinical Utility of the Trail-Making Test as a Predictor of Driving Performance in Older Adults. J Am Geriatr Soc. 2015 Nov;63(11):2358-64. doi: 10.1111/jgs.13776. Epub 2015 Oct 27. PMID: 73496376; PMCID: [id number].)  Pt prompted to look at his printed calendar to locate next therapy session (calendar on table, recently given at beginning of session). Pt unable to locate today's date and stated that his next therapy was on the 3rd (No), then it must be  on the 10th (No), then it must be on the 17th (no that is today) then it must be on the 19th.   PATIENT EDUCATION: Education details: as above Person educated: Patient and his friend Education method: Psychiatrist comprehension: verbalized understanding and needs further education   HOME EXERCISE PROGRAM:  Recommend pt begin using a calendar for recall of appts   GOALS:  Goals reviewed with patient? Yes  SHORT TERM GOALS: Target date: 10 sessions  Updated: 08/27/2024 With Moderate A to utilize finger scanning technique to attend to the left most side of the page, patient will read page-level information from a novel with 50% accuracy.  Baseline: Goal status: INITIAL: emerging maximal verbal cues to moderate  2.  With moderate A, pt will complete complex medication management task with 75% accuracy.  Baseline:  Goal status: INITIAL: emerging, progress made  3.  With moderate A, pt will complex money management task with 75& accuracy.  Baseline:  Goal status: INITIAL: emerging  4.  With moderate A, pt will demonstrate emergent awareness by self-monitoring and self-correcting errors in 90% of opportunities.  Baseline:  Goal status: INITIAL: MET with moderate; upgraded to With Min A, pt will demonstrate emergent awareness by self-monitoring and self-correcting errors in 75% of opportunities.    LONG TERM GOALS: Target date: 09/24/2024  Updated: 08/27/2024 With Supervision A, patient will demonstrate ability to complete complex reasoning tasks with 75% accuracy.  Baseline:  Goal status: INITIAL: Downgraded to With Mod A pt will demonstrate ability to complete semi-complex reasoning tasks with 75% accuracy.   2.  With Min A, pt will demonstrate anticipatory awareness by providing solution for hypothetical safety situation in 5 out of 7 opportunities.  Baseline:  Goal status: INITIAL: progress made  3.  Pt will report improved cognitive communication  via PROM by 5 points at last ST session   Baseline:  Goal status: INITIAL: stable   ASSESSMENT:  CLINICAL IMPRESSION: Patient is a 67 y.o. male  who was seen today for a cognitive communication treatment session d/t recent frontoparietal craniotomy for mass resection. He presents with moderate cognitive impairment c/b severe deficits in memory (short term, working memory, prospective memory), attention (selective and left sided), higher level problem solving, reasoning, executive functions and emergent awareness.   Pt continues to present with a moderate right hemisphere disorder. See the above treatment note for details.   OBJECTIVE IMPAIRMENTS include attention, memory, awareness, and executive functioning. These impairments are limiting patient from return to work, managing medications, managing appointments, managing finances, household responsibilities, ADLs/IADLs, and effectively communicating at home and in community. Factors affecting potential to achieve goals and functional outcome are medical prognosis, severity of impairments, and future radiation and infusions. Patient will benefit from skilled SLP services to address above impairments and improve overall function.  REHAB POTENTIAL: Good  PLAN: SLP FREQUENCY: 1-2x/week  SLP DURATION: 12 weeks  PLANNED INTERVENTIONS: Environmental controls, Cueing hierachy, Cognitive reorganization, Internal/external aids, Functional  tasks, SLP instruction and feedback, Compensatory strategies, and Patient/family education    Kendel Bessey B. Rubbie, M.S., CCC-SLP, Tree Surgeon Certified Brain Injury Specialist [facility]  [facility] Rehabilitation Services Office [phone] Ascom [phone] Fax [phone]

## 2024-08-29 ENCOUNTER — Ambulatory Visit: Admitting: Speech Pathology

## 2024-08-29 ENCOUNTER — Ambulatory Visit

## 2024-09-01 ENCOUNTER — Ambulatory Visit: Admitting: Occupational Therapy

## 2024-09-01 ENCOUNTER — Telehealth: Payer: Self-pay | Admitting: Speech Pathology

## 2024-09-01 ENCOUNTER — Ambulatory Visit: Admitting: Speech Pathology

## 2024-09-01 ENCOUNTER — Ambulatory Visit: Admitting: Physical Therapy

## 2024-09-01 NOTE — Telephone Encounter (Signed)
 Pt was a no show/no call for today's ST session.   This clinical research associate called and spoke with pt's daughter. She mentions that she forgot to cll as she is sick.   Confirmed next appt.   Tiernan Suto B. Rubbie, M.S., CCC-SLP, Tree Surgeon Certified Brain Injury Specialist [facility]  [facility] Rehabilitation Services Office [phone] Ascom [phone] Fax [phone]

## 2024-09-03 ENCOUNTER — Ambulatory Visit: Admitting: Physical Therapy

## 2024-09-03 ENCOUNTER — Ambulatory Visit

## 2024-09-09 ENCOUNTER — Ambulatory Visit: Admitting: Speech Pathology

## 2024-09-10 ENCOUNTER — Ambulatory Visit

## 2024-09-10 ENCOUNTER — Ambulatory Visit: Admitting: Speech Pathology

## 2024-09-10 ENCOUNTER — Ambulatory Visit: Admitting: Occupational Therapy

## 2024-09-10 ENCOUNTER — Telehealth: Payer: Self-pay | Admitting: Speech Pathology

## 2024-09-10 NOTE — Telephone Encounter (Signed)
 Pt currently admitted to [facility]. This clinical research associate called his daughter, offered listening ear and support. ST session cancelled for this coming Friday as suspected hospitalization likely.   Emanuelle Hammerstrom B. Rubbie, M.S., CCC-SLP, Tree Surgeon Certified Brain Injury Specialist [facility]  [facility] Rehabilitation Services Office [phone] Ascom [phone] Fax [phone]

## 2024-09-12 ENCOUNTER — Ambulatory Visit: Admitting: Speech Pathology

## 2024-09-12 ENCOUNTER — Ambulatory Visit: Admitting: Physical Therapy

## 2024-09-12 ENCOUNTER — Ambulatory Visit: Payer: Self-pay | Admitting: Speech Pathology

## 2024-09-17 ENCOUNTER — Ambulatory Visit

## 2024-09-17 ENCOUNTER — Ambulatory Visit: Admitting: Speech Pathology

## 2024-09-19 ENCOUNTER — Ambulatory Visit

## 2024-09-19 ENCOUNTER — Ambulatory Visit: Admitting: Speech Pathology

## 2024-09-24 ENCOUNTER — Ambulatory Visit

## 2024-09-26 ENCOUNTER — Encounter: Payer: Self-pay | Admitting: Urology

## 2024-09-26 ENCOUNTER — Ambulatory Visit: Admitting: Speech Pathology

## 2024-09-26 ENCOUNTER — Ambulatory Visit: Admitting: Urology

## 2024-09-26 ENCOUNTER — Ambulatory Visit

## 2024-09-26 VITALS — BP 138/87 | HR 102 | Ht 68.0 in | Wt 187.0 lb

## 2024-09-26 DIAGNOSIS — R350 Frequency of micturition: Secondary | ICD-10-CM

## 2024-09-26 DIAGNOSIS — R351 Nocturia: Secondary | ICD-10-CM

## 2024-09-26 DIAGNOSIS — R3915 Urgency of urination: Secondary | ICD-10-CM

## 2024-09-26 LAB — URINALYSIS, COMPLETE
Bilirubin, UA: NEGATIVE
Ketones, UA: NEGATIVE
Leukocytes,UA: NEGATIVE
Nitrite, UA: NEGATIVE
Protein,UA: NEGATIVE
RBC, UA: NEGATIVE
Specific Gravity, UA: 1.02 (ref 1.005–1.030)
Urobilinogen, Ur: 0.2 mg/dL (ref 0.2–1.0)
pH, UA: 6 (ref 5.0–7.5)

## 2024-09-26 LAB — MICROSCOPIC EXAMINATION

## 2024-09-26 LAB — BLADDER SCAN AMB NON-IMAGING

## 2024-09-26 MED ORDER — GEMTESA 75 MG PO TABS: 75.0000 mg | ORAL_TABLET | Freq: Every day | ORAL | Status: AC

## 2024-09-26 NOTE — Progress Notes (Signed)
 "  09/26/2024 8:53 AM   Sergio Kennedy 1955/12/04 [id number]  Referring provider: Epifanio Alm SQUIBB, MD [address] [place],  [place] [address]  Chief Complaint  Patient presents with   Benign Prostatic Hypertrophy    HPI: Sergio Kennedy is a 69 y.o. male referred for evaluation of lower urinary tract symptoms.  His daughter was with him today.  <1-year history of lower urinary tract symptoms consisting of urinary frequency, urgency and nocturia 5+ times per night No previous treatments for his urinary symptoms Past medical history significant for melanoma with brain metastasis.  Did note his symptoms worsened after craniotomy. + Sleep apnea on CPAP No obstructive voiding symptoms and states he voids with a good stream Denies dysuria, gross hematuria Occasional episodes urge incontinence IPSS today 20/35   PMH: Past Medical History:  Diagnosis Date   Cancer (HCC)    Class 1 obesity due to excess calories with serious comorbidity and body mass index (BMI) of 34.0 to 34.9 in adult    Diabetes mellitus without complication (HCC)    History of kidney stones    HTN (hypertension), benign    Hyperlipidemia    Hypertension    Hypothyroidism    Hypothyroidism, unspecified    Irregular heart rhythm    Malignant melanoma, unspecified site [facility])    Pre-diabetes    Sleep apnea    Thyroid disease     Surgical History: Past Surgical History:  Procedure Laterality Date   BACK SURGERY  01/2004   COLONOSCOPY N/A 02/15/2024   Procedure: COLONOSCOPY;  Surgeon: Onita Elspeth Sharper, DO;  Location: [facility] ENDOSCOPY;  Service: Gastroenterology;  Laterality: N/A;   EXCISION MASS NECK N/A 04/30/2024   Procedure: EXCISION, MASS, NECK;  Surgeon: Rodolph Romano, MD;  Location: [facility] ORS;  Service: General;  Laterality: N/A;   FOOT SURGERY     melanoma and lymph nodes removed  2023   on back side of the neck   POLYPECTOMY  02/15/2024   Procedure: POLYPECTOMY, INTESTINE;   Surgeon: Onita Elspeth Sharper, DO;  Location: [facility] ENDOSCOPY;  Service: Gastroenterology;;    Home Medications:  Allergies as of 09/26/2024       Reactions   Iodinated Contrast Media Dermatitis   Per Patient        Medication List        Accurate as of September 26, 2024  8:53 AM. If you have any questions, ask your nurse or doctor.          aspirin EC 81 MG tablet Take 81 mg by mouth daily.   cholecalciferol 25 MCG (1000 UNIT) tablet Commonly known as: VITAMIN D3 Take 1,000 Units by mouth daily.   ketoconazole 2 % shampoo Commonly known as: NIZORAL Apply 1 Application topically daily. Daily for a week, then once a week   levothyroxine 88 MCG tablet Commonly known as: SYNTHROID Take 88 mcg by mouth daily before breakfast.   losartan-hydrochlorothiazide 100-25 MG tablet Commonly known as: HYZAAR Take 1 tablet by mouth daily.   magnesium gluconate 500 (27 Mg) MG Tabs tablet Commonly known as: MAGONATE Take 500 mg by mouth daily.   multivitamin with minerals tablet Take 1 tablet by mouth daily.   simvastatin 10 MG tablet Commonly known as: ZOCOR Take 10 mg by mouth at bedtime.        Allergies: Allergies[1]  Family History: Family History  Problem Relation Age of Onset   Diabetes Mother    Colon cancer Father  Social History:  reports that he has never smoked. He has never used smokeless tobacco. He reports that he does not drink alcohol and does not use drugs.   Physical Exam: BP 138/87   Pulse (!) 102   Ht 5' 8 (1.727 m)   Wt 187 lb (84.8 kg)   BMI 28.43 kg/m   Constitutional:  Alert, No acute distress. HEENT: [place] AT Respiratory: Normal respiratory effort, no increased work of breathing. Psychiatric: Normal mood and affect.  Laboratory Data:  Urinalysis Dipstick/microscopy negative   Assessment & Plan:    1.  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  If no improvement in symptoms consider adding an alpha-blocker   Yarieliz Wasser C Jaivion Kingsley, MD  [facility] [address] [place], [place] [address] [phone]    [1]  Allergies Allergen Reactions   Iodinated Contrast Media Dermatitis    Per Patient   "

## 2024-09-30 ENCOUNTER — Encounter: Payer: Self-pay | Admitting: Internal Medicine

## 2024-09-30 ENCOUNTER — Ambulatory Visit: Admitting: Internal Medicine

## 2024-09-30 ENCOUNTER — Telehealth: Payer: Self-pay | Admitting: Internal Medicine

## 2024-09-30 VITALS — BP 161/96 | HR 109 | Temp 98.0°F | Resp 16 | Ht 68.0 in | Wt 197.0 lb

## 2024-09-30 DIAGNOSIS — G4733 Obstructive sleep apnea (adult) (pediatric): Secondary | ICD-10-CM

## 2024-09-30 DIAGNOSIS — C434 Malignant melanoma of scalp and neck: Secondary | ICD-10-CM

## 2024-09-30 DIAGNOSIS — Z7189 Other specified counseling: Secondary | ICD-10-CM

## 2024-09-30 NOTE — Patient Instructions (Signed)

## 2024-09-30 NOTE — Progress Notes (Unsigned)
 [facility] [address] [place], [place] [address]  Pulmonary Sleep Medicine   Office Visit Note  Patient Name: Sergio Kennedy DOB: 1956-08-14 MRN [id number]  Date of Service: 09/30/2024  Complaints/HPI: Patient has a CPAP and this is telling him the motor has reached end of life. He has not had a sleep study for over 7 years. He has noted some fatigue noted. Recently he has been on steroids. Patient has been diagnosed with a brain tumor. He did receive radiation and infusions. The tumor is a melanoma.   Office Spirometry Results:     ROS  General: (-) fever, (-) chills, (-) night sweats, (-) weakness Skin: (-) rashes, (-) itching,. Eyes: (-) visual changes, (-) redness, (-) itching. Nose and Sinuses: (-) nasal stuffiness or itchiness, (-) postnasal drip, (-) nosebleeds, (-) sinus trouble. Mouth and Throat: (-) sore throat, (-) hoarseness. Neck: (-) swollen glands, (-) enlarged thyroid, (-) neck pain. Respiratory: - cough, (-) bloody sputum, - shortness of breath, - wheezing. Cardiovascular: - ankle swelling, (-) chest pain. Lymphatic: (-) lymph node enlargement. Neurologic: (-) numbness, (-) tingling. Psychiatric: (-) anxiety, (-) depression   Current Medication: Outpatient Encounter Medications as of 09/30/2024  Medication Sig   acetaminophen  (TYLENOL ) 325 MG tablet Take 650 mg by mouth.   cholecalciferol (VITAMIN D3) 25 MCG (1000 UNIT) tablet Take 1,000 Units by mouth daily.   clindamycin (CLEOCIN T) 1 % external solution SMARTSIG:sparingly Topical Daily PRN   levothyroxine (SYNTHROID) 88 MCG tablet Take 88 mcg by mouth daily before breakfast.   losartan-hydrochlorothiazide (HYZAAR) 100-25 MG tablet Take 1 tablet by mouth daily.   magnesium gluconate (MAGONATE) 500 (27 Mg) MG TABS tablet Take 500 mg by mouth daily.   metFORMIN (GLUCOPHAGE-XR) 500 MG 24 hr tablet Take 500 mg by mouth 2 (two) times daily.   mometasone (ELOCON) 0.1 % cream Apply 1 Application  topically.   Multiple Vitamin (MULTI-VITAMIN) tablet Take 1 tablet by mouth every morning.   Multiple Vitamins-Minerals (MULTIVITAMIN WITH MINERALS) tablet Take 1 tablet by mouth daily.   pantoprazole (PROTONIX) 40 MG tablet Take 40 mg by mouth.   simvastatin (ZOCOR) 10 MG tablet Take 10 mg by mouth at bedtime.   sulfamethoxazole-trimethoprim (BACTRIM DS) 800-160 MG tablet Take 1 tablet by mouth.   Vibegron  (GEMTESA ) 75 MG TABS Take 1 tablet (75 mg total) by mouth daily.   No facility-administered encounter medications on file as of 09/30/2024.    Surgical History: Past Surgical History:  Procedure Laterality Date   BACK SURGERY  01/2004   COLONOSCOPY N/A 02/15/2024   Procedure: COLONOSCOPY;  Surgeon: Onita Elspeth Sharper, DO;  Location: [facility] ENDOSCOPY;  Service: Gastroenterology;  Laterality: N/A;   EXCISION MASS NECK N/A 04/30/2024   Procedure: EXCISION, MASS, NECK;  Surgeon: Rodolph Romano, MD;  Location: [facility] ORS;  Service: General;  Laterality: N/A;   FOOT SURGERY     melanoma and lymph nodes removed  2023   on back side of the neck   POLYPECTOMY  02/15/2024   Procedure: POLYPECTOMY, INTESTINE;  Surgeon: Onita Elspeth Sharper, DO;  Location: [facility] ENDOSCOPY;  Service: Gastroenterology;;    Medical History: Past Medical History:  Diagnosis Date   Cancer (HCC)    Class 1 obesity due to excess calories with serious comorbidity and body mass index (BMI) of 34.0 to 34.9 in adult    Diabetes mellitus without complication (HCC)    History of kidney stones    HTN (hypertension), benign    Hyperlipidemia  Hypertension    Hypothyroidism    Hypothyroidism, unspecified    Irregular heart rhythm    Malignant melanoma, unspecified site (HCC)    Pre-diabetes    Sleep apnea    Thyroid disease     Family History: Family History  Problem Relation Age of Onset   Diabetes Mother    Colon cancer Father     Social History: Social History   Socioeconomic History   Marital  status: Divorced    Spouse name: Not on file   Number of children: Not on file   Years of education: Not on file   Highest education level: Not on file  Occupational History   Not on file  Tobacco Use   Smoking status: Never   Smokeless tobacco: Never  Vaping Use   Vaping status: Never Used  Substance and Sexual Activity   Alcohol use: No   Drug use: Never   Sexual activity: Not on file  Other Topics Concern   Not on file  Social History Narrative   Not on file   Social Drivers of Health   Tobacco Use: Low Risk (09/30/2024)   Patient History    Smoking Tobacco Use: Never    Smokeless Tobacco Use: Never    Passive Exposure: Not on file  Financial Resource Strain: Low Risk  (09/24/2024)   Received from [facility]   Overall Financial Resource Strain (CARDIA)    Difficulty of Paying Living Expenses: Not hard at all  Food Insecurity: No Food Insecurity (09/24/2024)   Received from [facility]   Epic    Within the past 12 months, you worried that your food would run out before you got the money to buy more.: Never true    Within the past 12 months, the food you bought just didn't last and you didn't have money to get more.: Never true  Transportation Needs: No Transportation Needs (09/24/2024)   Received from [facility] - Transportation    In the past 12 months, has lack of transportation kept you from medical appointments or from getting medications?: No    Lack of Transportation (Non-Medical): No  Physical Activity: Not on file  Stress: Not on file  Social Connections: Not on file  Intimate Partner Violence: Patient Unable To Answer (07/04/2024)   Received from [facility]   Epic    Within the last year, have you been afraid of your partner or ex-partner?: Patient unable to answer    Within the last year, have you been humiliated or emotionally abused in other ways by your partner or ex-partner?: Patient  unable to answer    Within the last year, have you been kicked, hit, slapped, or otherwise physically hurt by your partner or ex-partner?: Patient unable to answer    Within the last year, have you been raped or forced to have any kind of sexual activity by your partner or ex-partner?: Patient unable to answer  Depression (PHQ2-9): Low Risk (12/15/2021)   Depression (PHQ2-9)    PHQ-2 Score: 0  Alcohol Screen: Not on file  Housing: Low Risk  (09/24/2024)   Received from [facility]   Epic    In the last 12 months, was there a time when you were not able to pay the mortgage or rent on time?: No    In the past 12 months, how many times have you moved where you were living?: 0  At any time in the past 12 months, were you homeless or living in a shelter (including now)?: No  Utilities: Not At Risk (09/24/2024)   Received from [facility]   Epic    In the past 12 months has the electric, gas, oil, or water company threatened to shut off services in your home?: No  Health Literacy: Not on file    Vital Signs: Blood pressure (!) 161/96, pulse (!) 109, temperature 98 F (36.7 C), resp. rate 16, height 5' 8 (1.727 m), weight 197 lb (89.4 kg), SpO2 99%.  Examination: General Appearance: The patient is well-developed, well-nourished, and in no distress. Skin: Gross inspection of skin unremarkable. Head: normocephalic, no gross deformities. Eyes: no gross deformities noted. ENT: ears appear grossly normal no exudates. Neck: Supple. No thyromegaly. No LAD. Respiratory: no rhonchi noted. Cardiovascular: Normal S1 and S2 without murmur or rub. Extremities: No cyanosis. pulses are equal. Neurologic: Alert and oriented. No involuntary movements.  LABS: Recent Results (from the past 2160 hours)  Urinalysis, Complete     Status: Abnormal   Collection Time: 09/26/24  8:44 AM  Result Value Ref Range   Specific Gravity, UA 1.020 1.005 - 1.030   pH, UA 6.0 5.0 -  7.5   Color, UA Yellow Yellow   Appearance Ur Clear Clear   Leukocytes,UA Negative Negative   Protein,UA Negative Negative/Trace   Glucose, UA Trace (A) Negative   Ketones, UA Negative Negative   RBC, UA Negative Negative   Bilirubin, UA Negative Negative   Urobilinogen, Ur 0.2 0.2 - 1.0 mg/dL   Nitrite, UA Negative Negative   Microscopic Examination Comment     Comment: Microscopic follows if indicated.   Microscopic Examination See below:     Comment: Microscopic was indicated and was performed.  Microscopic Examination     Status: Abnormal   Collection Time: 09/26/24  8:44 AM   Urine  Result Value Ref Range   WBC, UA 0-5 0 - 5 /hpf   RBC, Urine 0-2 0 - 2 /hpf   Epithelial Cells (non renal) 0-10 0 - 10 /hpf   Mucus, UA Present (A) Not Estab.   Bacteria, UA Few None seen/Few  BLADDER SCAN AMB NON-IMAGING     Status: None   Collection Time: 09/26/24  8:56 AM  Result Value Ref Range   Scan Result 0ml     Radiology: No results found.  No results found.  No results found.  Assessment and Plan: Patient Active Problem List   Diagnosis Date Noted   Obstructive sleep apnea 05/23/2018   Chronic atrial fibrillation (HCC) 05/23/2018    1. OSA (obstructive sleep apnea) (Primary) He has 100% compliant with his CPAP but machine is end of life needs new device. Will get a follow up sleep study due to age and weight gain  2. CPAP use counseling CPAP Counseling: had a lengthy discussion with the patient regarding the importance of PAP therapy in management of the sleep apnea. Patient appears to understand the risk factor reduction and also understands the risks associated with untreated sleep apnea.   3. Obesity, morbid (HCC) On steroids needs to watch diet and exercise. States he walks 5 miles  4. Malignant melanoma of neck (HCC) Continue to follow with oncology    General Counseling: I have discussed the findings of the evaluation and examination with Sergio Kennedy.  I have also  discussed any further diagnostic evaluation thatmay be needed or ordered today. Sergio Kennedy verbalizes understanding of the  findings of todays visit. We also reviewed his medications today and discussed drug interactions and side effects including but not limited excessive drowsiness and altered mental states. We also discussed that there is always a risk not just to him but also people around him. he has been encouraged to call the office with any questions or concerns that should arise related to todays visit.  Orders Placed This Encounter  Procedures   PSG Sleep Study    Standing Status:   Future    Expiration Date:   09/30/2025    Where should this test be performed::   [facility]     Time spent: 44  I have personally obtained a history, examined the patient, evaluated laboratory and imaging results, formulated the assessment and plan and placed orders.    Elfreda DELENA Bathe, MD [facility] Pulmonary and Critical Care Sleep medicine

## 2024-09-30 NOTE — Telephone Encounter (Signed)
 Awaiting today's office notes for SS order-Toni

## 2024-10-01 ENCOUNTER — Ambulatory Visit

## 2024-10-01 ENCOUNTER — Ambulatory Visit: Admitting: Speech Pathology

## 2024-10-03 ENCOUNTER — Ambulatory Visit

## 2024-10-03 ENCOUNTER — Ambulatory Visit: Admitting: Speech Pathology

## 2024-10-08 ENCOUNTER — Ambulatory Visit: Admitting: Speech Pathology

## 2024-10-08 ENCOUNTER — Ambulatory Visit

## 2024-10-08 ENCOUNTER — Ambulatory Visit: Payer: Self-pay | Admitting: Speech Pathology

## 2024-10-10 ENCOUNTER — Ambulatory Visit

## 2024-10-10 ENCOUNTER — Ambulatory Visit: Admitting: Speech Pathology

## 2024-10-15 ENCOUNTER — Ambulatory Visit

## 2024-10-15 ENCOUNTER — Ambulatory Visit: Payer: Self-pay | Admitting: Speech Pathology

## 2024-10-15 ENCOUNTER — Ambulatory Visit: Admitting: Speech Pathology

## 2024-10-15 DIAGNOSIS — R41841 Cognitive communication deficit: Secondary | ICD-10-CM

## 2024-10-15 NOTE — Therapy (Unsigned)
 " OUTPATIENT SPEECH LANGUAGE PATHOLOGY  EVALUATION   Patient Name: Sergio Kennedy MRN: [id number] DOB:[birth year], 69 y.o., male Today's Date: 10/15/2024  PCP: PIERRETTE REFERRING PROVIDER: ***   End of Session - 10/15/24 2024     Visit Number 1    Number of Visits 25    Date for Recertification  01/07/25    Authorization Type [employer] [facility] Medicare    Progress Note Due on Visit 10    SLP Start Time 1315    SLP Stop Time  1400    SLP Time Calculation (min) 45 min    Activity Tolerance Patient tolerated treatment well          No past medical history on file. *** The histories are not reviewed yet. Please review them in the History navigator section and refresh this SmartLink. Patient Active Problem List   Diagnosis Date Noted   Obstructive sleep apnea 05/23/2018   Chronic atrial fibrillation (HCC) 05/23/2018    ONSET DATE: ***   REFERRING DIAG: ***  THERAPY DIAG:  Cognitive communication deficit  Rationale for Evaluation and Treatment {HABREHAB:27488}  SUBJECTIVE:   SUBJECTIVE STATEMENT: *** Pt accompanied by: {accompnied:27141}  PERTINENT HISTORY: ***  DIAGNOSTIC FINDINGS: ***  PAIN:  Are you having pain? {OPRCPAIN:27236}   FALLS: Has patient fallen in last 6 months?  {DOEQJOOD:74320}  LIVING ENVIRONMENT: Lives with: {OPRC lives with:25569::lives with their family} Lives in: {Lives in:25570}  PLOF:  Level of assistance: {DOEEONQ:74326} Employment: {SLPemployment:25674}   PATIENT GOALS   ***  OBJECTIVE:   COGNITIVE COMMUNICATION: Overall cognitive status: {ARMCcognition:34472} Areas of impairment:  {ARMCcognitiveimpairment:28858} Functional deficits: ***  AUDITORY COMPREHENSION: Overall auditory comprehension: {IMPAIRED:25374} YES/NO questions: {IMPAIRED:25374} Following directions: {IMPAIRED:25374} Conversation: {SLP conversation:25430} Interfering components: {SLP interfering components:25431} Effective technique: {SLP  effective technique:25432}   READING COMPREHENSION: {SLPreadingcomprehension:27140}  EXPRESSION: {SLP EXPRESSION:25433}  VERBAL EXPRESSION: Level of generative/spontaneous verbalization: {SLP level of generative/spontaneious verbalization:25435} Automatic speech: {SLP ATOMIC SPEECH:25434}  Repetition: {SLPrepetion:27212} Naming: {SLPnaming:27214} Pragmatics: {slppragmatics:27216} Comments: *** Interfering components: {SLP INTERFERING COMPONENTS:25436} Effective technique: {SLP EFFECTIVE TECHNIQUE:25437} Non-verbal means of communication: {SLP non verbal means of communication:25438}   WRITTEN EXPRESSION: Dominant hand: {RIGHT/LEFT:20294}   Written expression: {slpwrittenexp:27209}  MOTOR SPEECH: Overall motor speech: {slpimpaired:27210} Level of impairment: {SLP level of impairment:25441} Respiration: {respbreathing:27195} Phonation: {SLP phonation:25439} Resonance: {SLP resonance:25440} Articulation: {SLParticulation:27218} Intelligibility: {SLP Intelligible:25442} Motor planning: {slpmotorspeecherrors:27220} Motor speech errors: {SLP motor speech errors:25443} Interfering components: {SLP Interfering components (MS):25444} Effective technique: {SLP effective technique (MS):25445}  ORAL MOTOR EXAMINATION: Facial : {OM Face:25663} Lingual: {OM Lingual:25664} Velum: {OM Velum:25665} Mandible: {OM mandible:25667} Cough: {OM Cough:25660} Voice: {OM Voice:25662}   STANDARDIZED ASSESSMENTS: {SLPCOGLINGassessments:27787}  PATIENT REPORTED OUTCOME MEASURES (PROM): {SLPPROM:27785}   TODAY'S TREATMENT:  ***   PATIENT EDUCATION: Education details: *** Person educated: {Person educated:25204} Education method: {Education Method:25205} Education comprehension: {Education Comprehension:25206}  HOME EXERCISE PROGRAM:        ***    GOALS:  Goals reviewed with patient? {yes/no:20286}  SHORT TERM GOALS: Target date: 10 sessions  *** Baseline: Goal status:  {GOALSTATUS:25110}   2.  *** Baseline:  Goal status: {GOALSTATUS:25110}  3.  *** Baseline:  Goal status: {GOALSTATUS:25110}  4.  *** Baseline:  Goal status: {GOALSTATUS:25110}  5.  *** Baseline:  Goal status: {GOALSTATUS:25110}  6.  *** Baseline:  Goal status: {GOALSTATUS:25110}  LONG TERM GOALS: Target date:   *** Baseline:  Goal status: {GOALSTATUS:25110}  2.  *** Baseline:  Goal status: {GOALSTATUS:25110}  3.  *** Baseline:  Goal status: {GOALSTATUS:25110}  4.  ***  Baseline:  Goal status: {GOALSTATUS:25110}  5.  *** Baseline:  Goal status: {GOALSTATUS:25110}  6.  *** Baseline:  Goal status: {GOALSTATUS:25110}  ASSESSMENT:  CLINICAL IMPRESSION:  Patient is a *** y.o. *** who was seen today for ***.   OBJECTIVE IMPAIRMENTS include {SLPOBJIMP:27107}. These impairments are limiting patient from {SLPLIMIT:27108}. Factors affecting potential to achieve goals and functional outcome are {SLP factors:25450}.. Patient will benefit from skilled SLP services to address above impairments and improve overall function.  REHAB POTENTIAL: {rehabpotential:25112}  PLAN: SLP FREQUENCY: {rehab frequency:25116}  SLP DURATION: {rehab duration:25117}  PLANNED INTERVENTIONS: {SLP treatment/interventions:25449}    Lyrika Souders B. Rubbie, M.S., CCC-SLP, CBIS Speech-Language Pathologist Certified Brain Injury Specialist [facility]  [facility] [phone] Ascom [phone] Fax [phone]           "

## 2024-10-16 NOTE — Therapy (Unsigned)
 " OUTPATIENT SPEECH LANGUAGE PATHOLOGY  COGNITION TREATMENT NOTE RE-EVALUATION   Patient Name: Sergio Kennedy MRN: [id number] DOB:06-May-1956, 69 y.o., male Today's Date: 10/16/2024   PCP: Alm Needle, MD REFERRING PROVIDER: Rosaria CHRISTELLA Lush, MD   End of Session - 10/15/24 2024     Visit Number 1    Number of Visits 25    Date for Recertification  01/07/25    Authorization Type [employer] [facility] Medicare    Progress Note Due on Visit 10    SLP Start Time 1315    SLP Stop Time  1400    SLP Time Calculation (min) 45 min    Activity Tolerance Patient tolerated treatment well            Past Medical History:  Diagnosis Date   Cancer (HCC)    Class 1 obesity due to excess calories with serious comorbidity and body mass index (BMI) of 34.0 to 34.9 in adult    Diabetes mellitus without complication (HCC)    History of kidney stones    HTN (hypertension), benign    Hyperlipidemia    Hypertension    Hypothyroidism    Hypothyroidism, unspecified    Irregular heart rhythm    Malignant melanoma, unspecified site [facility])    Pre-diabetes    Sleep apnea    Thyroid disease    Past Surgical History:  Procedure Laterality Date   BACK SURGERY  01/2004   COLONOSCOPY N/A 02/15/2024   Procedure: COLONOSCOPY;  Surgeon: Onita Elspeth Sharper, DO;  Location: [facility] ENDOSCOPY;  Service: Gastroenterology;  Laterality: N/A;   EXCISION MASS NECK N/A 04/30/2024   Procedure: EXCISION, MASS, NECK;  Surgeon: Rodolph Romano, MD;  Location: [facility] ORS;  Service: General;  Laterality: N/A;   FOOT SURGERY     melanoma and lymph nodes removed  2023   on back side of the neck   POLYPECTOMY  02/15/2024   Procedure: POLYPECTOMY, INTESTINE;  Surgeon: Onita Elspeth Sharper, DO;  Location[facility] ENDOSCOPY;  Service: Gastroenterology;;   Patient Active Problem List   Diagnosis Date Noted   Obstructive sleep apnea 05/23/2018   Chronic atrial fibrillation (HCC) 05/23/2018    ONSET DATE:  06/16/2024 neurosurgery; readmission 12/   REFERRING DIAG:  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 Pt had right craniotomy for tumor resection on 06/16/2024.   Medical Tests / Procedures Comments: MRI 10/6: Postoperative changes of right frontoparietal craniotomy for mass resection with postoperative blood products within the resection cavity and surgical tract.    Layering blood products in the supratentorial and infratentorial ventricular system without hydrocephalus.    Mild linear and nodular enhancement along the cavity margins, nonspecific, could reflect postsurgical inflammatory changes. Attention on follow-up.    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  Pt with Cognitive Linguistic Evaluation while admitted at [facility] on 06/17/2024. Per chart, pt tested [facility] across cognitive-linguistic domains assessed during testing.  07/04/2024 Cycle #1 ipilimumab 3mg /kg + nivolumab 1mg /kg immunotherapy  MRI 07/08/2024 Postsurgical changes right frontoparietal craniotomy for mass resection. There is hemorrhage in the resection cavity, in the right basal ganglia, and extending into the right lateral ventricle, overall decreased since prior. There  are a few nodular foci of enhancement along the medial margin of the resection cavity (16:122, 16:119, 16:116), more conspicuous since prior. Similar vasogenic edema in the right frontal lobe. Increased T2/FLAIR hyperintense signal in the right basal ganglia (5:16), likely vasogenic edema. Small amount of hemorrhage layering in the left occipital horn, decreased since prior. There is  overall decreased blood products throughout the remaining ventricles. There is also hemosiderin deposition along the left basilar cistern. Decreased size of evolving bilateral subdural collections, right greater than left.  07/12/2024 thru 07/16/2024 admitted for grade 4 dermatitis secondary to ipilimumab/nivomumab immunotherapy  07/28/2024 Completion of fifth fraction of SBRT using CyberKnife.  Treatment Site: Brain Tumor Volume Fx dose: 600 cGy Total dose thus far received: 3000/3000 cGy   08/16/2024 cycle #2 ipilimumab 3mg /kg + nivolumab 1mg /kg   PAIN:  Are you having pain? No   FALLS: Has patient fallen in last 6 months?  See PT evaluation for details  LIVING ENVIRONMENT: Lives with: lives with their daughter Lives in: House/apartment  PLOF:  Level of assistance: Independent with ADLs, Independent with IADLs Employment: Full-time employment   PATIENT GOALS   to improve cognitive function for return to independent living, driving and working  SUBJECTIVE STATEMENT: Pt eager, pt accompanied by friend, reports that his daughter wasn't able to go out of town but is currently sick Pt accompanied by: self, friend Chryl)  OBJECTIVE:   TODAY'S TREATMENT:  Skilled treatment session targeted pt's cognitive communication goals. SLP facilitated session by providing the following interventions:  Pt arrives with his friend Chryl) - pt states that he has moved back to his house with cameras in place and his daughter has provided support/reminders (pt shows this statistician) of the lists etc. Pt reports success.   Pt benefited from moderate A during mental memory task.  TRAIL MAKING TEST (TMT) Parts A & B Both parts of the Trail Making Test consist of 25 circles distributed over a sheet of paper. In Part A, the circles are numbered 1 - 25, and the patient should draw lines to connect the numbers in ascending order. In Part B, the circles include both numbers (1 - 13) and letters (A -  L); as in Part A, the patient draws lines to connect the circles in an ascending pattern, but with the added task of alternating between the numbers and letters (i.e., 1-A-2-B-3-C, etc.).   Results for both TMT A and B are reported as the number of seconds required to complete the task; therefore, higher scores reveal greater impairment.  Results:  Trail A - DEFICIENT - 172 seconds (n=29 seconds; Deficient > 78 seconds) - difficulty noted with left scanning and extended time for termination of task at END - previous result (07/09/2024) 189 seconds Trail B - DEFICIENT - 407 seconds (n= 75 seconds; Deficient > 273 seconds) - difficulty noted with left scanning, working memory and alternating attention: previous results (07/09/2024) 409 seconds  TMTs have been shown to be significantly correlated with impaired driving on road tests by older drivers. TMT-A provided the best utility for determining a range of scores (68-90 sec) for which additional road testing would be indicated in general practice settings.(Papandonatos GD, Layla CAVES, [address] Barco PP, Carr DB. Clinical Utility of the Trail-Making Test as a Predictor of Driving Performance in Older Adults. J Am Geriatr Soc. 2015 Nov;63(11):2358-64. doi: 10.1111/jgs.13776. Epub 2015 Oct 27. PMID: 73496376; PMCID: [id number].)  Pt prompted to look at his printed calendar to locate next therapy session (  calendar on table, recently given at beginning of session). Pt unable to locate today's date and stated that his next therapy was on the 3rd (No), then it must be on the 10th (No), then it must be on the 17th (no that is today) then it must be on the 19th.   PATIENT EDUCATION: Education details: as above Person educated: Patient and his friend Education method: Psychiatrist comprehension: verbalized understanding and needs further education   HOME EXERCISE PROGRAM:  Recommend pt begin using a calendar for recall of  appts   GOALS:  Goals reviewed with patient? Yes  SHORT TERM GOALS: Target date: 10 sessions  Updated: 08/27/2024 With Moderate A to utilize finger scanning technique to attend to the left most side of the page, patient will read page-level information from a novel with 50% accuracy.  Baseline: Goal status: INITIAL: emerging maximal verbal cues to moderate  2.  With moderate A, pt will complete complex medication management task with 75% accuracy.  Baseline:  Goal status: INITIAL: emerging, progress made  3.  With moderate A, pt will complex money management task with 75& accuracy.  Baseline:  Goal status: INITIAL: emerging  4.  With moderate A, pt will demonstrate emergent awareness by self-monitoring and self-correcting errors in 90% of opportunities.  Baseline:  Goal status: INITIAL: MET with moderate; upgraded to With Min A, pt will demonstrate emergent awareness by self-monitoring and self-correcting errors in 75% of opportunities.    LONG TERM GOALS: Target date: 09/24/2024  Updated: 08/27/2024 With Supervision A, patient will demonstrate ability to complete complex reasoning tasks with 75% accuracy.  Baseline:  Goal status: INITIAL: Downgraded to With Mod A pt will demonstrate ability to complete semi-complex reasoning tasks with 75% accuracy.   2.  With Min A, pt will demonstrate anticipatory awareness by providing solution for hypothetical safety situation in 5 out of 7 opportunities.  Baseline:  Goal status: INITIAL: progress made  3.  Pt will report improved cognitive communication via PROM by 5 points at last ST session   Baseline:  Goal status: INITIAL: stable   ASSESSMENT:  CLINICAL IMPRESSION: Patient is a 69 y.o. male  who was seen today for a cognitive communication treatment session d/t recent frontoparietal craniotomy for mass resection. He presents with moderate cognitive impairment c/b severe deficits in memory (short term, working memory, prospective  memory), attention (selective and left sided), higher level problem solving, reasoning, executive functions and emergent awareness.   Pt continues to present with a moderate right hemisphere disorder. See the above treatment note for details.   OBJECTIVE IMPAIRMENTS include attention, memory, awareness, and executive functioning. These impairments are limiting patient from return to work, managing medications, managing appointments, managing finances, household responsibilities, ADLs/IADLs, and effectively communicating at home and in community. Factors affecting potential to achieve goals and functional outcome are medical prognosis, severity of impairments, and future radiation and infusions. Patient will benefit from skilled SLP services to address above impairments and improve overall function.  REHAB POTENTIAL: Good  PLAN: SLP FREQUENCY: 1-2x/week  SLP DURATION: 12 weeks  PLANNED INTERVENTIONS: Environmental controls, Cueing hierachy, Cognitive reorganization, Internal/external aids, Functional tasks, SLP instruction and feedback, Compensatory strategies, and Patient/family education    Jolin Benavides B. Rubbie, M.S., CCC-SLP, CBIS Speech-Language Pathologist Certified Brain Injury Specialist [facility]  [facility] [phone] Ascom [phone] Fax [phone]  "

## 2024-10-17 ENCOUNTER — Ambulatory Visit: Payer: Self-pay | Admitting: Speech Pathology

## 2024-10-17 ENCOUNTER — Ambulatory Visit: Admitting: Speech Pathology

## 2024-10-17 ENCOUNTER — Ambulatory Visit

## 2024-10-17 DIAGNOSIS — R41841 Cognitive communication deficit: Secondary | ICD-10-CM

## 2024-10-17 NOTE — Therapy (Unsigned)
 " OUTPATIENT SPEECH LANGUAGE PATHOLOGY  COGNITION TREATMENT NOTE RE-EVALUATION   Patient Name: Sergio Kennedy MRN: [id number] DOB:[birth year], 69 y.o., male Today's Date: 10/17/2024   PCP: Alm Needle, MD REFERRING PROVIDER: Rosaria CHRISTELLA Lush, MD   End of Session - 10/17/24 1407     Visit Number 2    Number of Visits 25    Date for Recertification  01/07/25    Authorization Type Blue Cross [facility] Medicare    Progress Note Due on Visit 10    SLP Start Time [phone]    SLP Stop Time  [phone]    SLP Time Calculation (min) 40 min    Activity Tolerance Patient tolerated treatment well            Past Medical History:  Diagnosis Date   Cancer (HCC)    Class 1 obesity due to excess calories with serious comorbidity and body mass index (BMI) of 34.0 to 34.9 in adult    Diabetes mellitus without complication (HCC)    History of kidney stones    HTN (hypertension), benign    Hyperlipidemia    Hypertension    Hypothyroidism    Hypothyroidism, unspecified    Irregular heart rhythm    Malignant melanoma, unspecified site [facility])    Pre-diabetes    Sleep apnea    Thyroid disease    Past Surgical History:  Procedure Laterality Date   BACK SURGERY  01/2004   COLONOSCOPY N/A 02/15/2024   Procedure: COLONOSCOPY;  Surgeon: Onita Elspeth Sharper, DO;  Location: [facility] ENDOSCOPY;  Service: Gastroenterology;  Laterality: N/A;   EXCISION MASS NECK N/A 04/30/2024   Procedure: EXCISION, MASS, NECK;  Surgeon: Rodolph Romano, MD;  Location: [facility] ORS;  Service: General;  Laterality: N/A;   FOOT SURGERY     melanoma and lymph nodes removed  2023   on back side of the neck   POLYPECTOMY  02/15/2024   Procedure: POLYPECTOMY, INTESTINE;  Surgeon: Onita Elspeth Sharper, DO;  Location: [facility] ENDOSCOPY;  Service: Gastroenterology;;   Patient Active Problem List   Diagnosis Date Noted   Obstructive sleep apnea 05/23/2018   Chronic atrial fibrillation (HCC) 05/23/2018    ONSET DATE:  06/16/2024 neurosurgery; readmission 12/   REFERRING DIAG:  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 Pt had right craniotomy for tumor resection on 06/16/2024.   Medical Tests / Procedures Comments: MRI 10/6: Postoperative changes of right frontoparietal craniotomy for mass resection with postoperative blood products within the resection cavity and surgical tract.    Layering blood products in the supratentorial and infratentorial ventricular system without hydrocephalus.    Mild linear and nodular enhancement along the cavity margins, nonspecific, could reflect postsurgical inflammatory changes. Attention on follow-up.    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  Pt with Cognitive Linguistic Evaluation while admitted at [facility] on 06/17/2024. Per chart, pt tested [facility] across cognitive-linguistic domains assessed during testing.  07/04/2024 Cycle #1 ipilimumab 3mg /kg + nivolumab 1mg /kg immunotherapy  MRI 07/08/2024 Postsurgical changes right frontoparietal craniotomy for mass resection. There is hemorrhage in the resection cavity, in the right basal ganglia, and extending into the right lateral ventricle, overall decreased since prior. There  are a few nodular foci of enhancement along the medial margin of the resection cavity (16:122, 16:119, 16:116), more conspicuous since prior. Similar vasogenic edema in the right frontal lobe. Increased T2/FLAIR hyperintense signal in the right basal ganglia (5:16), likely vasogenic edema. Small amount of hemorrhage layering in the left occipital horn, decreased since prior. There is  overall decreased blood products throughout the remaining ventricles. There is also hemosiderin deposition along the left basilar cistern. Decreased size of evolving bilateral subdural collections, right greater than left.  07/12/2024 thru 07/16/2024 admitted for grade 4 dermatitis secondary to ipilimumab/nivomumab immunotherapy  07/28/2024 Completion of fifth fraction of SBRT using CyberKnife.  Treatment Site: Brain Tumor Volume Fx dose: 600 cGy Total dose thus far received: 3000/3000 cGy   08/16/2024 cycle #2 ipilimumab 3mg /kg + nivolumab 1mg /kg   PAIN:  Are you having pain? No   FALLS: Has patient fallen in last 6 months?  See PT evaluation for details  LIVING ENVIRONMENT: Lives with: lives with their daughter Lives in: House/apartment  PLOF:  Level of assistance: Independent with ADLs, Independent with IADLs Employment: Full-time employment   PATIENT GOALS   to improve cognitive function for return to independent living, driving and working  SUBJECTIVE STATEMENT: Pt eager, pt accompanied by friend, reports that his daughter wasn't able to go out of town but is currently sick Pt accompanied by: self, friend Chryl)  OBJECTIVE:   TODAY'S TREATMENT:  Skilled treatment session targeted pt's cognitive communication goals. SLP facilitated session by providing the following interventions:  Pt arrives with his friend Chryl) - pt states that he has moved back to his house with cameras in place and his daughter has provided support/reminders (pt shows this statistician) of the lists etc. Pt reports success.   Pt benefited from moderate A during mental memory task.  TRAIL MAKING TEST (TMT) Parts A & B Both parts of the Trail Making Test consist of 25 circles distributed over a sheet of paper. In Part A, the circles are numbered 1 - 25, and the patient should draw lines to connect the numbers in ascending order. In Part B, the circles include both numbers (1 - 13) and letters (A -  L); as in Part A, the patient draws lines to connect the circles in an ascending pattern, but with the added task of alternating between the numbers and letters (i.e., 1-A-2-B-3-C, etc.).   Results for both TMT A and B are reported as the number of seconds required to complete the task; therefore, higher scores reveal greater impairment.  Results:  Trail A - DEFICIENT - 172 seconds (n=29 seconds; Deficient > 78 seconds) - difficulty noted with left scanning and extended time for termination of task at END - previous result (07/09/2024) 189 seconds Trail B - DEFICIENT - 407 seconds (n= 75 seconds; Deficient > 273 seconds) - difficulty noted with left scanning, working memory and alternating attention: previous results (07/09/2024) 409 seconds  TMTs have been shown to be significantly correlated with impaired driving on road tests by older drivers. TMT-A provided the best utility for determining a range of scores (68-90 sec) for which additional road testing would be indicated in general practice settings.(Papandonatos GD, Layla CAVES, [address] Barco PP, Carr DB. Clinical Utility of the Trail-Making Test as a Predictor of Driving Performance in Older Adults. J Am Geriatr Soc. 2015 Nov;63(11):2358-64. doi: 10.1111/jgs.13776. Epub 2015 Oct 27. PMID: 73496376; PMCID: [id number].)  Pt prompted to look at his printed calendar to locate next therapy session (  calendar on table, recently given at beginning of session). Pt unable to locate today's date and stated that his next therapy was on the 3rd (No), then it must be on the 10th (No), then it must be on the 17th (no that is today) then it must be on the 19th.   PATIENT EDUCATION: Education details: as above Person educated: Patient and his friend Education method: Psychiatrist comprehension: verbalized understanding and needs further education   HOME EXERCISE PROGRAM:  Recommend pt begin using a calendar for recall of  appts   GOALS:  Goals reviewed with patient? Yes  SHORT TERM GOALS: Target date: 10 sessions  Updated: 08/27/2024 With Moderate A to utilize finger scanning technique to attend to the left most side of the page, patient will read page-level information from a novel with 50% accuracy.  Baseline: Goal status: INITIAL: emerging maximal verbal cues to moderate  2.  With moderate A, pt will complete complex medication management task with 75% accuracy.  Baseline:  Goal status: INITIAL: emerging, progress made  3.  With moderate A, pt will complex money management task with 75& accuracy.  Baseline:  Goal status: INITIAL: emerging  4.  With moderate A, pt will demonstrate emergent awareness by self-monitoring and self-correcting errors in 90% of opportunities.  Baseline:  Goal status: INITIAL: MET with moderate; upgraded to With Min A, pt will demonstrate emergent awareness by self-monitoring and self-correcting errors in 75% of opportunities.    LONG TERM GOALS: Target date: 09/24/2024  Updated: 08/27/2024 With Supervision A, patient will demonstrate ability to complete complex reasoning tasks with 75% accuracy.  Baseline:  Goal status: INITIAL: Downgraded to With Mod A pt will demonstrate ability to complete semi-complex reasoning tasks with 75% accuracy.   2.  With Min A, pt will demonstrate anticipatory awareness by providing solution for hypothetical safety situation in 5 out of 7 opportunities.  Baseline:  Goal status: INITIAL: progress made  3.  Pt will report improved cognitive communication via PROM by 5 points at last ST session   Baseline:  Goal status: INITIAL: stable   ASSESSMENT:  CLINICAL IMPRESSION: Patient is a 69 y.o. male  who was seen today for a cognitive communication treatment session d/t recent frontoparietal craniotomy for mass resection. He presents with moderate cognitive impairment c/b severe deficits in memory (short term, working memory, prospective  memory), attention (selective and left sided), higher level problem solving, reasoning, executive functions and emergent awareness.   Pt continues to present with a moderate right hemisphere disorder. See the above treatment note for details.   OBJECTIVE IMPAIRMENTS include attention, memory, awareness, and executive functioning. These impairments are limiting patient from return to work, managing medications, managing appointments, managing finances, household responsibilities, ADLs/IADLs, and effectively communicating at home and in community. Factors affecting potential to achieve goals and functional outcome are medical prognosis, severity of impairments, and future radiation and infusions. Patient will benefit from skilled SLP services to address above impairments and improve overall function.  REHAB POTENTIAL: Good  PLAN: SLP FREQUENCY: 1-2x/week  SLP DURATION: 12 weeks  PLANNED INTERVENTIONS: Environmental controls, Cueing hierachy, Cognitive reorganization, Internal/external aids, Functional tasks, SLP instruction and feedback, Compensatory strategies, and Patient/family education    Nikola Blackston B. Rubbie, M.S., CCC-SLP, CBIS Speech-Language Pathologist Certified Brain Injury Specialist [facility]  [facility] [phone] Ascom [phone] Fax [phone]  "

## 2024-10-22 ENCOUNTER — Ambulatory Visit

## 2024-10-22 ENCOUNTER — Ambulatory Visit: Admitting: Speech Pathology

## 2024-10-24 ENCOUNTER — Ambulatory Visit

## 2024-10-24 ENCOUNTER — Ambulatory Visit: Admitting: Speech Pathology

## 2024-10-27 ENCOUNTER — Ambulatory Visit: Admitting: Physician Assistant

## 2024-10-29 ENCOUNTER — Ambulatory Visit

## 2024-10-29 ENCOUNTER — Ambulatory Visit: Admitting: Speech Pathology

## 2024-10-31 ENCOUNTER — Ambulatory Visit

## 2024-10-31 ENCOUNTER — Ambulatory Visit: Admitting: Speech Pathology

## 2024-11-05 ENCOUNTER — Ambulatory Visit

## 2024-11-05 ENCOUNTER — Ambulatory Visit: Admitting: Speech Pathology

## 2024-11-07 ENCOUNTER — Ambulatory Visit: Admitting: Speech Pathology

## 2024-11-07 ENCOUNTER — Ambulatory Visit

## 2024-11-12 ENCOUNTER — Ambulatory Visit

## 2024-11-12 ENCOUNTER — Ambulatory Visit: Admitting: Speech Pathology

## 2024-11-14 ENCOUNTER — Ambulatory Visit: Admitting: Speech Pathology

## 2024-11-14 ENCOUNTER — Ambulatory Visit

## 2024-11-14 ENCOUNTER — Ambulatory Visit: Admitting: Physical Therapy

## 2024-11-19 ENCOUNTER — Ambulatory Visit

## 2024-11-19 ENCOUNTER — Ambulatory Visit: Admitting: Physical Therapy

## 2024-11-19 ENCOUNTER — Ambulatory Visit: Admitting: Speech Pathology

## 2024-11-21 ENCOUNTER — Ambulatory Visit: Admitting: Physical Therapy

## 2024-11-21 ENCOUNTER — Ambulatory Visit: Admitting: Speech Pathology

## 2024-11-21 ENCOUNTER — Ambulatory Visit

## 2024-11-26 ENCOUNTER — Ambulatory Visit: Admitting: Speech Pathology

## 2024-11-28 ENCOUNTER — Ambulatory Visit: Admitting: Speech Pathology

## 2024-12-03 ENCOUNTER — Ambulatory Visit: Admitting: Speech Pathology

## 2024-12-05 ENCOUNTER — Ambulatory Visit: Admitting: Speech Pathology

## 2024-12-10 ENCOUNTER — Ambulatory Visit: Admitting: Speech Pathology

## 2024-12-12 ENCOUNTER — Ambulatory Visit: Admitting: Speech Pathology

## 2024-12-19 ENCOUNTER — Ambulatory Visit: Admitting: Physician Assistant

## 2024-12-29 ENCOUNTER — Ambulatory Visit: Admitting: Internal Medicine
